# Patient Record
Sex: Female | Born: 1970 | Race: White | Hispanic: No | Marital: Married | Smoking: Former smoker
Health system: Southern US, Community
[De-identification: ages and names within clinical notes are randomized; demographics above are authoritative.]

## PROBLEM LIST (undated history)

## (undated) DIAGNOSIS — M797 Fibromyalgia: Secondary | ICD-10-CM

## (undated) DIAGNOSIS — G4730 Sleep apnea, unspecified: Secondary | ICD-10-CM

## (undated) DIAGNOSIS — R011 Cardiac murmur, unspecified: Secondary | ICD-10-CM

## (undated) DIAGNOSIS — R51 Headache: Secondary | ICD-10-CM

## (undated) DIAGNOSIS — J45909 Unspecified asthma, uncomplicated: Secondary | ICD-10-CM

## (undated) DIAGNOSIS — E039 Hypothyroidism, unspecified: Secondary | ICD-10-CM

## (undated) DIAGNOSIS — K219 Gastro-esophageal reflux disease without esophagitis: Secondary | ICD-10-CM

## (undated) HISTORY — PX: CARPAL TUNNEL RELEASE: SHX101

## (undated) HISTORY — PX: COLONOSCOPY: SHX5424

## (undated) HISTORY — PX: WISDOM TOOTH EXTRACTION: SHX21

---

## 2005-08-14 ENCOUNTER — Ambulatory Visit: Payer: Self-pay | Admitting: Family Medicine

## 2005-12-11 ENCOUNTER — Ambulatory Visit: Payer: Self-pay | Admitting: Unknown Physician Specialty

## 2005-12-18 ENCOUNTER — Ambulatory Visit: Payer: Self-pay | Admitting: Unknown Physician Specialty

## 2006-04-08 IMAGING — RF DG UGI W/ KUB
1 series · 14 of 24 positions shown · non-contrast
Comparison: none

REASON FOR EXAM: Abdominal pain, nausea
COMMENTS:

[Series 1: run · 14 of 25 slices shown]
[im 1/25]
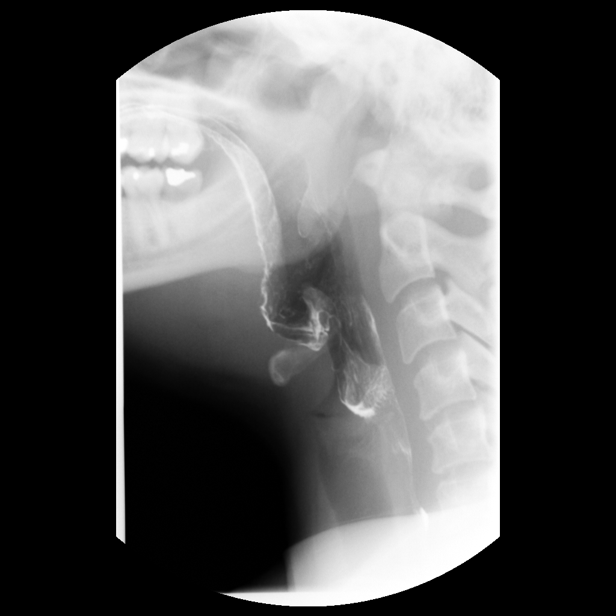
[im 3/25]
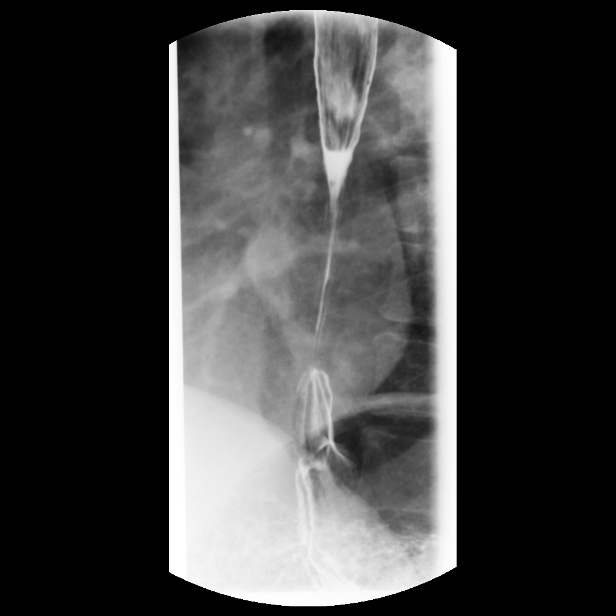
[im 5/25]
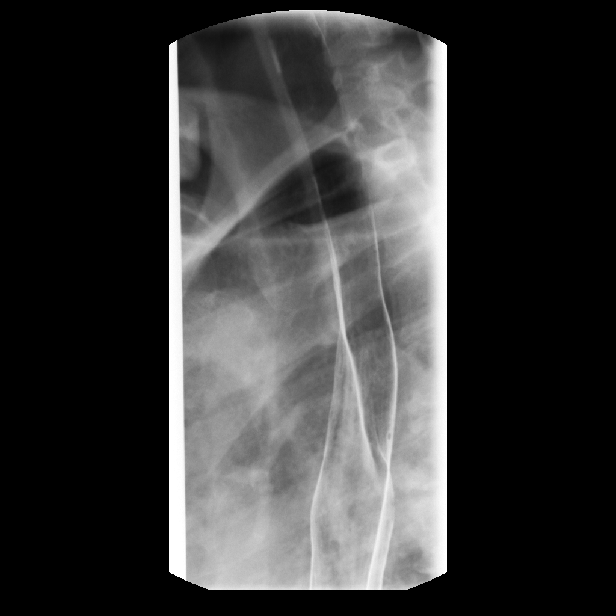
[im 7/25]
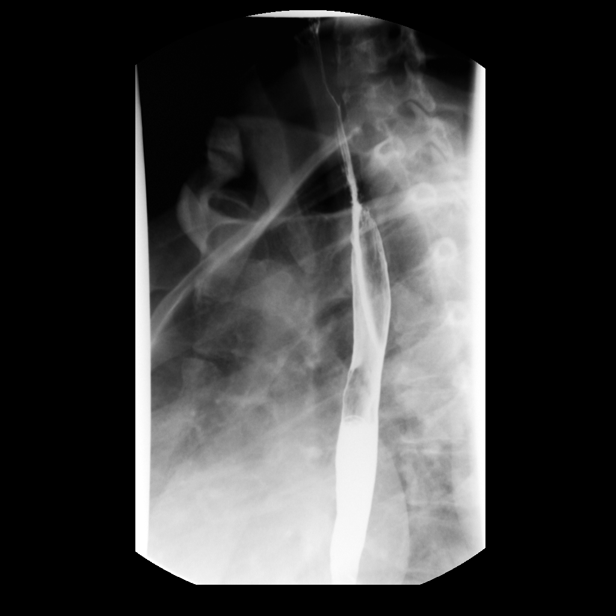
[im 8/25]
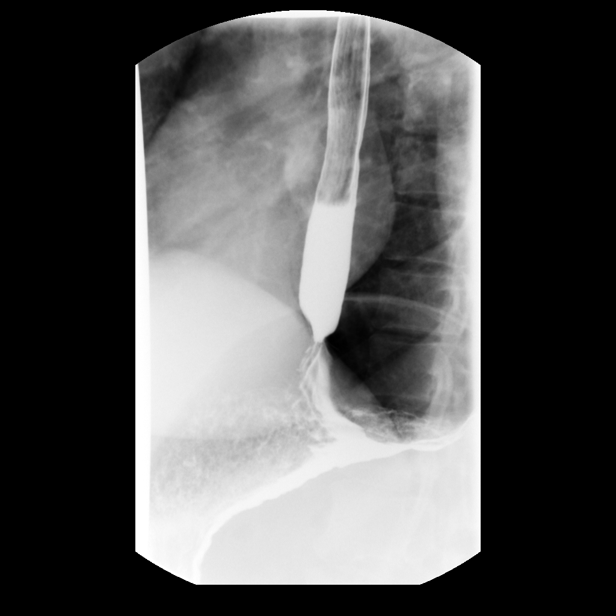
[im 10/25]
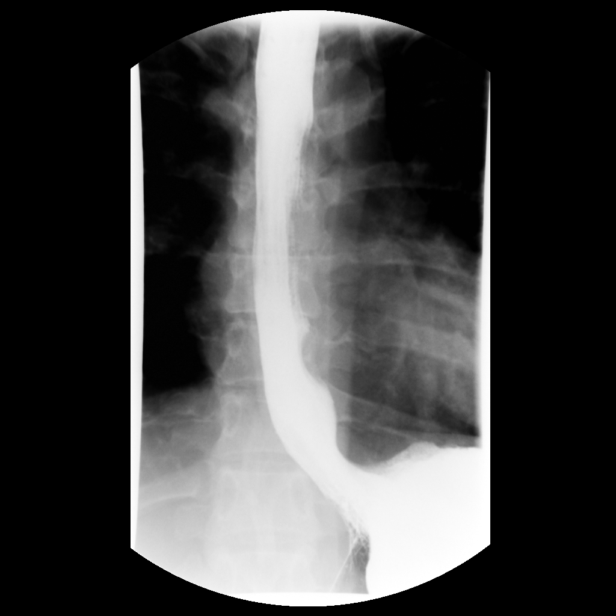
[im 12/25]
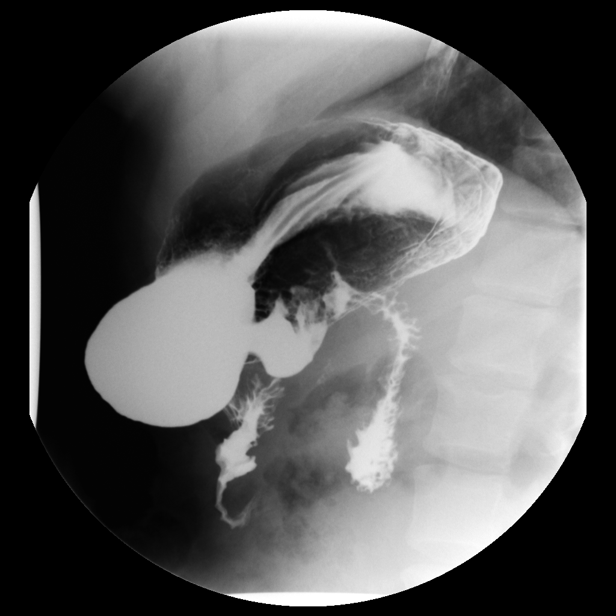
[im 13/25]
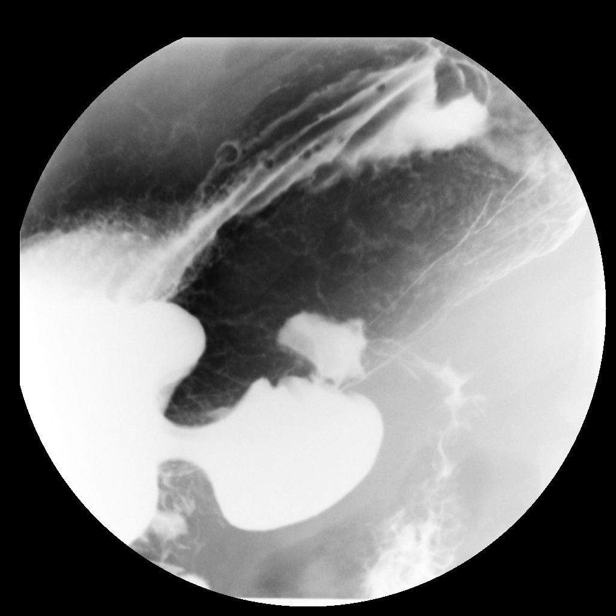
[im 15/25]
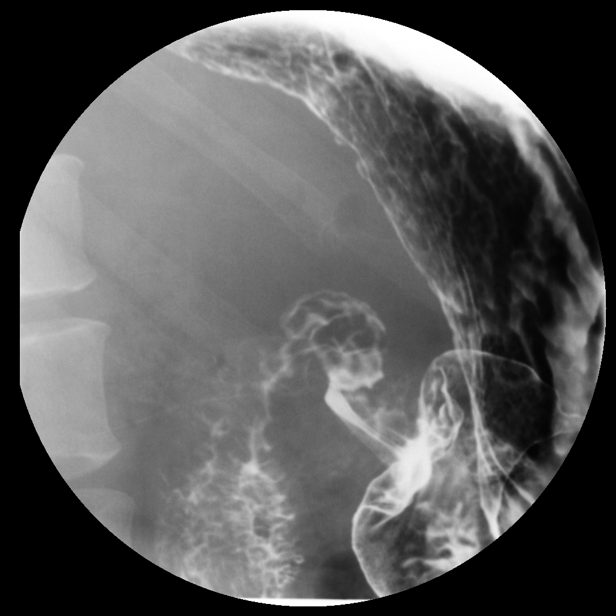
[im 17/25]
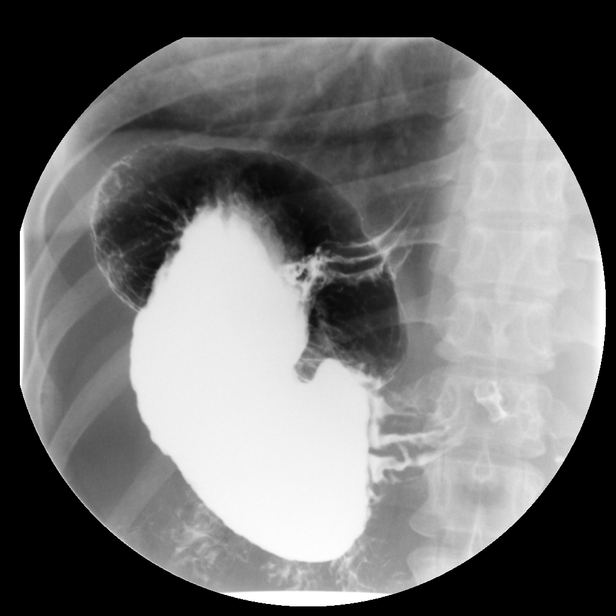
[im 19/25]
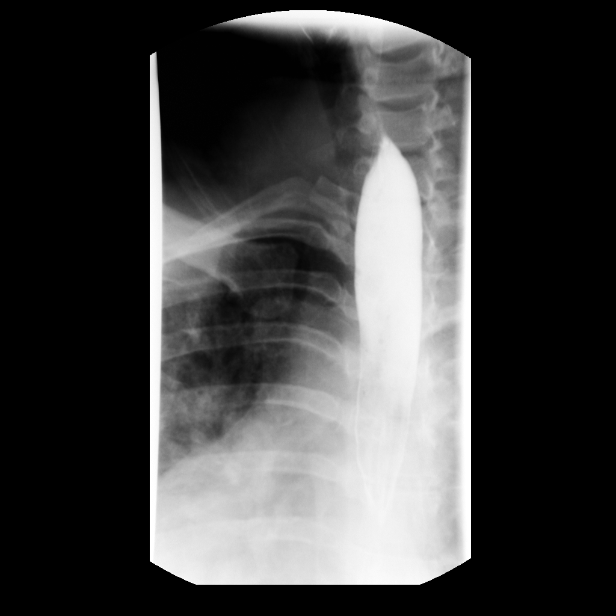
[im 20/25]
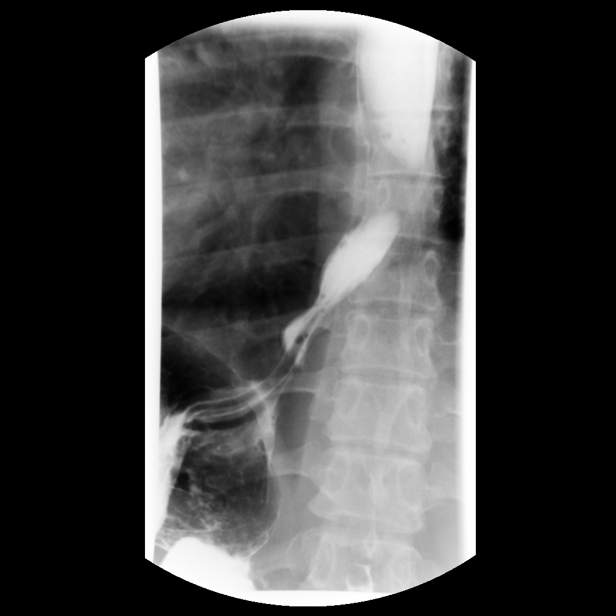
[im 22/25]
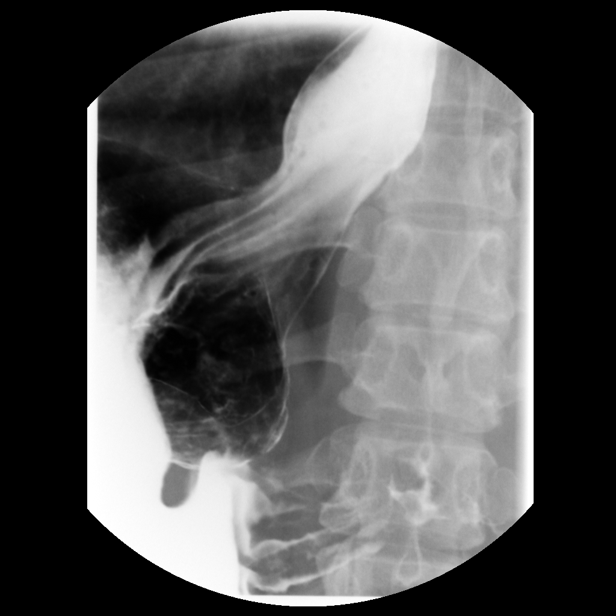
[im 25/25]
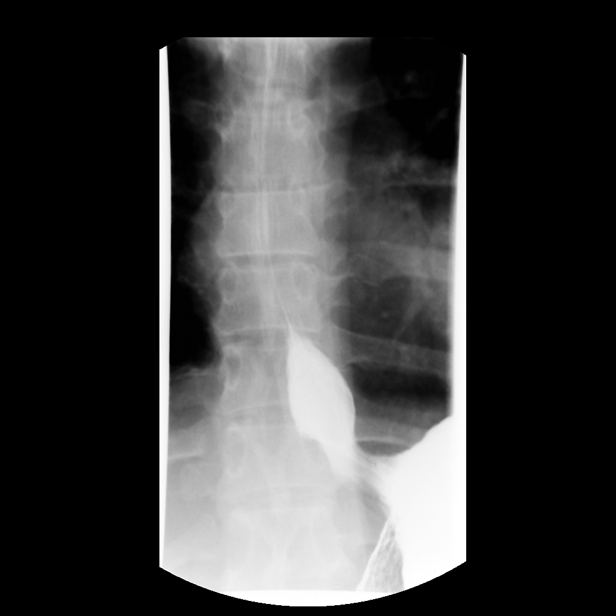

[14 of 24 positions shown; findings below may reference images not displayed]

PROCEDURE:     FL  - FL UPPER GI W/ BARIUM SWALLOW  - December 18, 2005  [DATE]

RESULT:     The anticipated procedure was discussed with Mrs. Nayiba.  The
patient has a history of RIGHT upper quadrant discomfort.  The patient has
undergone recent hepatobiliary scan and abdominal ultrasound which were
negative.  The patient has had a recent negative pregnancy test.  The
patient voiced her willingness to proceed.  The cervical esophagus distended
well.  There was no laryngeal penetration of the barium.  The thoracic
esophagus also distended well.  Esophageal motility appeared normal.  There
was no evidence of esophagitis.  However, there is marked gastroesophageal
reflux when the patient turns from the supine to the prone position and back
again.  This does clear spontaneously.  There is no significant hiatal
hernia demonstrated though I cannot exclude a very small reducible hiatal
hernia.

The stomach distends well.  Gastric emptying appears normal.  The gastric
mucosa appears normal.  The duodenal bulb and C sweep were normal in
appearance.

During the study the patient experienced her typical pain.  It was over the
RIGHT upper quadrant of the abdomen in the region of some gas at the level
of the hepatic flexure.  The patient did not voice any tenderness to
palpation over the visualized stomach and duodenum.  The 12-mm barium pill
passed without difficulty.
IMPRESSION: There is marked gastroesophageal reflux which occurs with changes in patient
positioning but which does clear spontaneously.  No more than a small
reducible hiatal hernia was seen.  There is no evidence of esophagitis.

The stomach and duodenal bulb and C sweep appear normal.

The patient's pain was noted to be in the RIGHT upper quadrant of the
abdomen overlying the region of the liver and the hepatic flexure of the
colon.  Given the lack of significant abnormality beyond reflux on the
current study and the negative abdominal ultrasound and hepatobiliary scan,
if further imaging is desired, CT scanning of the abdomen and pelvis may be
the most useful next steps.

## 2006-05-27 ENCOUNTER — Other Ambulatory Visit: Payer: Self-pay

## 2006-05-27 ENCOUNTER — Emergency Department: Payer: Self-pay | Admitting: Emergency Medicine

## 2006-05-31 ENCOUNTER — Emergency Department: Payer: Self-pay

## 2006-06-17 ENCOUNTER — Ambulatory Visit: Payer: Self-pay | Admitting: Unknown Physician Specialty

## 2006-07-14 ENCOUNTER — Ambulatory Visit: Payer: Self-pay | Admitting: Unknown Physician Specialty

## 2007-01-04 ENCOUNTER — Ambulatory Visit: Payer: Self-pay | Admitting: Unknown Physician Specialty

## 2007-01-10 ENCOUNTER — Ambulatory Visit: Payer: Self-pay | Admitting: Unknown Physician Specialty

## 2007-12-20 ENCOUNTER — Ambulatory Visit: Payer: Self-pay

## 2008-02-16 ENCOUNTER — Emergency Department: Payer: Self-pay | Admitting: Emergency Medicine

## 2009-04-03 ENCOUNTER — Encounter: Admission: RE | Admit: 2009-04-03 | Discharge: 2009-07-02 | Payer: Self-pay | Admitting: Family Medicine

## 2012-04-20 ENCOUNTER — Other Ambulatory Visit: Payer: Self-pay | Admitting: Neurosurgery

## 2012-04-21 ENCOUNTER — Encounter (HOSPITAL_COMMUNITY): Payer: Self-pay | Admitting: Pharmacy Technician

## 2012-04-25 ENCOUNTER — Encounter (HOSPITAL_COMMUNITY): Payer: Self-pay

## 2012-04-25 ENCOUNTER — Encounter (HOSPITAL_COMMUNITY)
Admission: RE | Admit: 2012-04-25 | Discharge: 2012-04-25 | Disposition: A | Discharge status: Home / Self Care | Payer: PRIVATE HEALTH INSURANCE | Source: Ambulatory Visit | Attending: Neurosurgery | Admitting: Neurosurgery

## 2012-04-25 HISTORY — DX: Fibromyalgia: M79.7

## 2012-04-25 HISTORY — DX: Unspecified asthma, uncomplicated: J45.909

## 2012-04-25 HISTORY — DX: Headache: R51

## 2012-04-25 HISTORY — DX: Hypothyroidism, unspecified: E03.9

## 2012-04-25 HISTORY — DX: Gastro-esophageal reflux disease without esophagitis: K21.9

## 2012-04-25 HISTORY — DX: Sleep apnea, unspecified: G47.30

## 2012-04-25 HISTORY — DX: Cardiac murmur, unspecified: R01.1

## 2012-04-25 LAB — CBC
HCT: 37.3 % (ref 36.0–46.0)
Hemoglobin: 12.6 g/dL (ref 12.0–15.0)
MCH: 31 pg (ref 26.0–34.0)
MCHC: 33.8 g/dL (ref 30.0–36.0)
RDW: 12.4 % (ref 11.5–15.5)

## 2012-04-25 NOTE — Pre-Procedure Instructions (Signed)
21 Lauren Fowler  04/25/2012   Your procedure is scheduled on: Thursday, June 27th.  Report to [facility] at 10:20AM.  Call this number if you have problems the morning of surgery: [phone]   Remember:   Do not eat food:After Midnight.  May have clear liquids:  Take these medicines the morning of surgery with A SIP OF WATER: Levothyroxine (Synthyroid), Omeprazole (Prilosec), Gabapentin (Neurontin).  May take Oxycodone- Acetaminophen (Percocet) if needed.  Do not wear jewelry, make-up or nail polish.  Do not wear lotions, powders, or perfumes. You may wear deodorant.  Do not shave 48 hours prior to surgery. Men may shave face and neck.  Do not bring valuables to the hospital.  Contacts, dentures or bridgework may not be worn into surgery.  Leave suitcase in the car. After surgery it may be brought to your room.  For patients admitted to the hospital, checkout time is 11:00 AM the day of discharge.   Patients discharged the day of surgery will not be allowed to drive home.  Name and phone number of your driver: NA  Special Instructions: CHG Shower Use Special Wash: 1/2 bottle night before surgery and 1/2 bottle morning of surgery.   Please read over the following fact sheets that you were given: Pain Booklet, Coughing and Deep Breathing and Surgical Site Infection Prevention

## 2012-04-27 NOTE — H&P (Signed)
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 She complains of pain in her left arm to her fingertips and numbness in her left arm, and weakness in her left arm. She says this began 03/04/2012 when she was exercising in a boot camp class in an effort to lose weight to increase her fertility.  She says that currently she is having horrible pain in her left arm and describes it has "liquid fire" to her first and second digits.  She has been taking Gabapentin 900 mg. three times daily, Vicodin 5/500 two to three per day, and Flexeril 10 mg. once daily.    REVIEW OF SYSTEMS:   A detailed Review of Systems sheet was reviewed with the patient.  Pertinent positives include under eyes - she wears glasses, under cardiovascular - she notes heart murmur, under musculoskeletal - she notes arm weakness and arm pain, under endocrine - she notes thyroid disease.  All other systems are negative; this includes Constitutional symptoms, Ears, nose, mouth, throat, Respiratory, Gastrointestinal, Genitourinary, Integumentary & Breast, Neurologic, Psychiatric, Hematologic/Lymphatic, Allergic/Immunologic.    PAST MEDICAL HISTORY:    . Current Medical Conditions:    She has a history of hypothyroidism, GERD, and heart murmur.    . Prior Operations and Hospitalizations:   She has had previous right carpal tunnel release in 2007.    . Medications and Allergies:  Current medications - Levothyroxine 125 mcg. daily for hypothyroidism, Gabapentin 900 mg. three times daily for pain, Hydrocodone/APAP 5/500 every four to six hours as needed for pain, Cyclobenzaprine 10 mg. once daily for muscle spasms, and Omeprazole 20 mg. daily for acid reflux.  She has no known drug allergies.    Marland Kitchen Height and Weight:     She is currently 5'4" tall with a weight of 217 lbs.  This  corresponds with a BMI of 37.2.     FAMILY HISTORY:    Her mother is 60 in good in good health.  Her father died. There is a family history of high blood pressure and heart attack.    SOCIAL HISTORY:    She denies tobacco, alcohol, or drug use.    DIAGNOSTIC STUDIES:   She underwent cervical radiographs which showed mild degenerative changes at C3-4 and some reversal of the cervical lordosis, with C5-6 and C4-5  left-sided foraminal stenosis, most pronounced at the C5-6 level on the left.    The MRI was done the 24th of May 2013 and shows a left paracentral to foraminal disc herniation which impinges the ventral roots at the C4-5 level. At C5-6 there is a broad-based disc bulge and endplate and uncovertebral osteophytes which contribute to moderate to advanced biforaminal stenosis, with a small central disc herniation. The other levels do not appear to be severely affected.    PHYSICAL EXAMINATION:    . General Appearance:   On examination today, Lauren Fowler is a pleasant and cooperative woman in no acute distress.    . Blood Pressure, Pulse:     Her blood pressure is 148/98.  Heart rate is 80 and regular.  Respiratory rate is 18.    Marland Kitchen HEENT - normocephalic, atraumatic.  The pupils are equal, round and reactive to light.  The extraocular muscles are intact.  Sclerae - white.  Conjunctiva - pink.  Oropharynx  benign.  Uvula midline.   . Neck - there are no masses, meningismus, deformities, tracheal deviation, jugular vein distention or carotid bruits.  There is normal cervical range of motion.  She has a positive Spurlings' maneuver to the left, positive parascapular discomfort to the left. She has negative shoulder impingement testing bilaterally.  Lhermitte's sign is not present with axial compression.    Marland Kitchen Respiratory - there is normal respiratory effort with good intercostal function.  Lungs are clear to auscultation.  There are no rales, rhonchi or wheezes.    . Cardiovascular - the heart  has regular rate and rhythm to auscultation.  No murmurs are appreciated.  There is no extremity edema, cyanosis or clubbing.  There are palpable pedal pulses.     . Abdomen - soft, nontender, no hepatosplenomegaly appreciated or masses.  There are active bowel sounds.  No guarding or rebound.    . Musculoskeletal Examination - the patient is able to walk about the examining room with a normal, casual, heel and toe gait.    NEUROLOGICAL EXAMINATION: The patient is oriented to time, person and place and has good recall of both recent and remote memory with normal attention span and concentration.  The patient speaks with clear and fluent speech and exhibits normal language function and appropriate fund of knowledge.    . Cranial Nerve Examination - pupils are equal, round and reactive to light.  Extraocular movements are full.  Visual fields are full to confrontational testing.  Facial sensation and facial movement are symmetric and intact.  Hearing is intact to finger rub.  Palate is upgoing.  Shoulder shrug is symmetric.  Tongue protrudes in the midline.    . Motor Examination - motor strength is 5/5 in the bilateral, triceps, handgrips, interosseous, 4+/5 left deltoid strength, 5/5 right deltoid, 4-/5 left biceps and wrist extension strength, and 5/5 right biceps and wrist extension.  In the lower extremities motor strength is 5/5 in hip flexion, extension, quadriceps, hamstrings, plantar flexion, dorsiflexion and extensor hallucis longus.    . Sensory Examination - she has decreased pin sensation in a left distribution in the left deltoid. She says that her first and second digits, while they are painful and feel numb, do not have diminished sensation to pinprick.    . Deep Tendon Reflexes - Deep tendon reflexes are trace in the upper extremities, 2 in the knees, 2 in the ankles.  The great toes are downgoing to plantar stimulation.    . Cerebellar Examination - normal coordination in upper and  lower extremities and normal rapid alternating movements.  Romberg test is negative.    IMPRESSION AND RECOMMENDATIONS: Lauren Fowler is a 41 year old woman with left arm pain and weakness.  She describes this as miserably painful.  She wants to get some relief.  She has significant disc herniations at C4-5 and C5-6 levels.  I have recommended that we go ahead with surgery.  We will set this up for 04/28/2012.  I am going to take her out of work for approximately six weeks postoperatively.  She was fitted for a soft cervical collar today. We spent in excess of 45 minutes meeting with the patient and discussing surgery and reviewing attendant risks and benefits.  The plan is for anterior cervical decompression and fusion at C4-5 and C5-6 levels on 04/28/2012.    I have recommended to the patient that she undergo anterior cervical discectomy and fusion with plating.  I went over the diagnostic studies in detail  and reviewed surgical models and also discussed the exact nature of the surgical procedure, attendant risks, potential benefits and typical operative and postoperative course.  I discussed the risks of surgery which include, but are not limited to, risks of anesthesia, blood loss, infection, injury to various neck structures including the trachea, esophagus, which could cause temporary or permanent swallowing difficulties and also the potential for perforation of the esophagus which might require operative intervention, larynx, recurrent laryngeal nerve, which could cause either temporary or permanent vocal cord paralysis resulting in either temporary or permanent voice changes, injury to cervical nerve roots, which could cause either temporary or permanent arm pain, numbness and/or weakness.  There is a small chance of injury to the spinal cord which could cause paralysis.  There is also the potential for malplacement of instrumentation, fusion failure, need for repeat surgery, degenerative disease at  other levels in the neck, failure to relieve the pain, worsening of pain.  I also discussed with the patient that she will lose some neck mobility from the surgery.  It is typical to stay in the hospital overnight after this operation.  Typically she will not be able to drive for two weeks after surgery and will come back to see me two weeks following surgery with a lateral C-spine x-ray and then for monthly visits to three months after surgery.  Generally patients are out of work for four to six weeks following surgery.  She will wear a soft collar for two weeks after surgery.     [facility]    Danae Orleans. Venetia Maxon, M.D.  JDS:aft

## 2012-04-27 NOTE — Interval H&P Note (Signed)
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 After consideration of risks, benefits and other options for treatment, the patient has consented to  Procedure(s) (LRB): ANTERIOR CERVICAL DECOMPRESSION/DISCECTOMY FUSION 2 LEVELS (N/A) as a surgical intervention .  The patient's history has been reviewed, patient examined, no change in status, stable for surgery.  I have reviewed the patients' chart and labs.  Questions were answered to the patient's satisfaction.     [name] D  Date of Initial H&P: 04/27/2012  History reviewed, patient examined, no change in status, stable for surgery.

## 2012-04-28 ENCOUNTER — Encounter (HOSPITAL_COMMUNITY): Payer: Self-pay | Admitting: *Deleted

## 2012-04-28 ENCOUNTER — Ambulatory Visit (HOSPITAL_COMMUNITY)
Admission: RE | Admit: 2012-04-28 | Discharge: 2012-04-29 | Disposition: A | Discharge status: Home / Self Care | Payer: PRIVATE HEALTH INSURANCE | Source: Ambulatory Visit | Attending: Neurosurgery | Admitting: Neurosurgery

## 2012-04-28 ENCOUNTER — Encounter (HOSPITAL_COMMUNITY)
Admission: RE | Disposition: A | Discharge status: Home / Self Care | Payer: Self-pay | Source: Ambulatory Visit | Attending: Neurosurgery

## 2012-04-28 ENCOUNTER — Ambulatory Visit (HOSPITAL_COMMUNITY): Payer: PRIVATE HEALTH INSURANCE

## 2012-04-28 ENCOUNTER — Ambulatory Visit (HOSPITAL_COMMUNITY): Payer: PRIVATE HEALTH INSURANCE | Admitting: Certified Registered Nurse Anesthetist

## 2012-04-28 ENCOUNTER — Encounter (HOSPITAL_COMMUNITY): Payer: Self-pay | Admitting: Certified Registered Nurse Anesthetist

## 2012-04-28 DIAGNOSIS — IMO0001 Reserved for inherently not codable concepts without codable children: Secondary | ICD-10-CM | POA: Insufficient documentation

## 2012-04-28 DIAGNOSIS — M47812 Spondylosis without myelopathy or radiculopathy, cervical region: Secondary | ICD-10-CM | POA: Insufficient documentation

## 2012-04-28 DIAGNOSIS — Z01812 Encounter for preprocedural laboratory examination: Secondary | ICD-10-CM | POA: Insufficient documentation

## 2012-04-28 DIAGNOSIS — G4730 Sleep apnea, unspecified: Secondary | ICD-10-CM | POA: Insufficient documentation

## 2012-04-28 DIAGNOSIS — Z01810 Encounter for preprocedural cardiovascular examination: Secondary | ICD-10-CM | POA: Insufficient documentation

## 2012-04-28 DIAGNOSIS — J45909 Unspecified asthma, uncomplicated: Secondary | ICD-10-CM | POA: Insufficient documentation

## 2012-04-28 DIAGNOSIS — M5020 Other cervical disc displacement, unspecified cervical region: Secondary | ICD-10-CM | POA: Insufficient documentation

## 2012-04-28 DIAGNOSIS — K219 Gastro-esophageal reflux disease without esophagitis: Secondary | ICD-10-CM | POA: Insufficient documentation

## 2012-04-28 HISTORY — PX: ANTERIOR CERVICAL DECOMP/DISCECTOMY FUSION: SHX1161

## 2012-04-28 SURGERY — ANTERIOR CERVICAL DECOMPRESSION/DISCECTOMY FUSION 2 LEVELS
Anesthesia: General | Site: Neck | Wound class: Clean

## 2012-04-28 MED ORDER — OXYCODONE-ACETAMINOPHEN 5-325 MG PO TABS: 1.0000 | ORAL_TABLET | ORAL | Status: DC | PRN

## 2012-04-28 MED ORDER — BACITRACIN 50000 UNITS IM SOLR
INTRAMUSCULAR | Status: DC | PRN
  Administered 2012-04-28: 14:00:00

## 2012-04-28 MED ORDER — VECURONIUM BROMIDE 10 MG IV SOLR
INTRAVENOUS | Status: DC | PRN
  Administered 2012-04-28: 2 mg via INTRAVENOUS

## 2012-04-28 MED ORDER — OXYCODONE-ACETAMINOPHEN 5-325 MG PO TABS
1.0000 | ORAL_TABLET | ORAL | Status: DC | PRN
Start: 2012-04-28 — End: 2012-04-29
  Administered 2012-04-28: 2 via ORAL
  Filled 2012-04-28: qty 2

## 2012-04-28 MED ORDER — DIAZEPAM 5 MG PO TABS: 5.0000 mg | ORAL_TABLET | Freq: Four times a day (QID) | ORAL | Status: DC | PRN

## 2012-04-28 MED ORDER — PHENYLEPHRINE HCL 10 MG/ML IJ SOLN
INTRAMUSCULAR | Status: DC | PRN
  Administered 2012-04-28: 80 ug via INTRAVENOUS
  Administered 2012-04-28: 40 ug via INTRAVENOUS
  Administered 2012-04-28: 80 ug via INTRAVENOUS

## 2012-04-28 MED ORDER — BACITRACIN 50000 UNITS IM SOLR
INTRAMUSCULAR | Status: AC
  Filled 2012-04-28: qty 1

## 2012-04-28 MED ORDER — CEFAZOLIN SODIUM-DEXTROSE 2-3 GM-% IV SOLR: 2.0000 g | INTRAVENOUS | Status: DC

## 2012-04-28 MED ORDER — ROCURONIUM BROMIDE 100 MG/10ML IV SOLN
INTRAVENOUS | Status: DC | PRN
  Administered 2012-04-28: 50 mg via INTRAVENOUS

## 2012-04-28 MED ORDER — 0.9 % SODIUM CHLORIDE (POUR BTL) OPTIME
TOPICAL | Status: DC | PRN
  Administered 2012-04-28: 1000 mL

## 2012-04-28 MED ORDER — ONDANSETRON HCL 4 MG/2ML IJ SOLN
4.0000 mg | INTRAMUSCULAR | Status: DC | PRN
  Administered 2012-04-28: 4 mg via INTRAVENOUS
  Filled 2012-04-28: qty 2

## 2012-04-28 MED ORDER — GABAPENTIN 300 MG PO CAPS
900.0000 mg | ORAL_CAPSULE | Freq: Three times a day (TID) | ORAL | Status: DC
  Administered 2012-04-28: 900 mg via ORAL
  Filled 2012-04-28 (×5): qty 3

## 2012-04-28 MED ORDER — CEFAZOLIN SODIUM-DEXTROSE 2-3 GM-% IV SOLR
INTRAVENOUS | Status: AC
  Filled 2012-04-28: qty 50

## 2012-04-28 MED ORDER — CEFAZOLIN SODIUM 1-5 GM-% IV SOLN: 1.0000 g | INTRAVENOUS | Status: DC

## 2012-04-28 MED ORDER — HYDROCODONE-ACETAMINOPHEN 5-325 MG PO TABS: 1.0000 | ORAL_TABLET | ORAL | Status: DC | PRN

## 2012-04-28 MED ORDER — IPRATROPIUM-ALBUTEROL 18-103 MCG/ACT IN AERO
1.0000 | INHALATION_SPRAY | Freq: Four times a day (QID) | RESPIRATORY_TRACT | Status: DC | PRN
  Filled 2012-04-28: qty 14.7

## 2012-04-28 MED ORDER — ZOLPIDEM TARTRATE 5 MG PO TABS: 10.0000 mg | ORAL_TABLET | Freq: Every evening | ORAL | Status: DC | PRN

## 2012-04-28 MED ORDER — HEMOSTATIC AGENTS (NO CHARGE) OPTIME
TOPICAL | Status: DC | PRN
  Administered 2012-04-28: 1 via TOPICAL

## 2012-04-28 MED ORDER — ACETAMINOPHEN 650 MG RE SUPP: 650.0000 mg | RECTAL | Status: DC | PRN

## 2012-04-28 MED ORDER — SODIUM CHLORIDE 0.9 % IV SOLN
INTRAVENOUS | Status: AC
  Filled 2012-04-28: qty 500

## 2012-04-28 MED ORDER — DEXAMETHASONE SODIUM PHOSPHATE 4 MG/ML IJ SOLN
INTRAMUSCULAR | Status: DC | PRN
  Administered 2012-04-28: 10 mg via INTRAVENOUS

## 2012-04-28 MED ORDER — EPHEDRINE SULFATE 50 MG/ML IJ SOLN
INTRAMUSCULAR | Status: DC | PRN
  Administered 2012-04-28 (×2): 10 mg via INTRAVENOUS

## 2012-04-28 MED ORDER — HYDROMORPHONE HCL PF 1 MG/ML IJ SOLN
0.2500 mg | INTRAMUSCULAR | Status: DC | PRN
  Administered 2012-04-28 (×4): 0.5 mg via INTRAVENOUS

## 2012-04-28 MED ORDER — THROMBIN 5000 UNITS EX KIT
PACK | CUTANEOUS | Status: DC | PRN
  Administered 2012-04-28 (×2): 5000 [IU] via TOPICAL

## 2012-04-28 MED ORDER — HYDROMORPHONE HCL PF 1 MG/ML IJ SOLN
INTRAMUSCULAR | Status: AC
  Filled 2012-04-28: qty 1

## 2012-04-28 MED ORDER — LIDOCAINE HCL (CARDIAC) 20 MG/ML IV SOLN
INTRAVENOUS | Status: DC | PRN
  Administered 2012-04-28: 80 mg via INTRAVENOUS

## 2012-04-28 MED ORDER — MIDAZOLAM HCL 5 MG/5ML IJ SOLN
INTRAMUSCULAR | Status: DC | PRN
  Administered 2012-04-28: 2 mg via INTRAVENOUS

## 2012-04-28 MED ORDER — ACETAMINOPHEN 325 MG PO TABS: 650.0000 mg | ORAL_TABLET | ORAL | Status: DC | PRN

## 2012-04-28 MED ORDER — LACTATED RINGERS IV SOLN
INTRAVENOUS | Status: DC | PRN
  Administered 2012-04-28 (×2): via INTRAVENOUS

## 2012-04-28 MED ORDER — KCL IN DEXTROSE-NACL 20-5-0.45 MEQ/L-%-% IV SOLN
INTRAVENOUS | Status: AC
  Filled 2012-04-28: qty 1000

## 2012-04-28 MED ORDER — MORPHINE SULFATE 2 MG/ML IJ SOLN: 1.0000 mg | INTRAMUSCULAR | Status: DC | PRN

## 2012-04-28 MED ORDER — PROPOFOL 10 MG/ML IV BOLUS
INTRAVENOUS | Status: DC | PRN
  Administered 2012-04-28: 160 mg via INTRAVENOUS

## 2012-04-28 MED ORDER — PANTOPRAZOLE SODIUM 40 MG IV SOLR: 40.0000 mg | Freq: Every day | INTRAVENOUS | Status: DC

## 2012-04-28 MED ORDER — KCL IN DEXTROSE-NACL 20-5-0.45 MEQ/L-%-% IV SOLN
INTRAVENOUS | Status: DC
  Administered 2012-04-28: 17:00:00 via INTRAVENOUS
  Filled 2012-04-28 (×3): qty 1000

## 2012-04-28 MED ORDER — ONDANSETRON HCL 4 MG/2ML IJ SOLN
INTRAMUSCULAR | Status: DC | PRN
  Administered 2012-04-28: 4 mg via INTRAVENOUS

## 2012-04-28 MED ORDER — DIAZEPAM 5 MG PO TABS
5.0000 mg | ORAL_TABLET | Freq: Every day | ORAL | Status: DC
  Administered 2012-04-28: 5 mg via ORAL
  Filled 2012-04-28: qty 1

## 2012-04-28 MED ORDER — FENTANYL CITRATE 0.05 MG/ML IJ SOLN
INTRAMUSCULAR | Status: DC | PRN
  Administered 2012-04-28: 200 ug via INTRAVENOUS
  Administered 2012-04-28: 50 ug via INTRAVENOUS

## 2012-04-28 MED ORDER — DOCUSATE SODIUM 100 MG PO CAPS
100.0000 mg | ORAL_CAPSULE | Freq: Two times a day (BID) | ORAL | Status: DC
  Administered 2012-04-28: 100 mg via ORAL
  Filled 2012-04-28: qty 1

## 2012-04-28 MED ORDER — PANTOPRAZOLE SODIUM 40 MG PO TBEC: 40.0000 mg | DELAYED_RELEASE_TABLET | Freq: Every day | ORAL | Status: DC

## 2012-04-28 MED ORDER — SODIUM CHLORIDE 0.9 % IJ SOLN
3.0000 mL | Freq: Two times a day (BID) | INTRAMUSCULAR | Status: DC
  Administered 2012-04-28: 3 mL via INTRAVENOUS

## 2012-04-28 MED ORDER — CEFAZOLIN SODIUM 1-5 GM-% IV SOLN
1.0000 g | Freq: Three times a day (TID) | INTRAVENOUS | Status: AC
  Administered 2012-04-28 – 2012-04-29 (×2): 1 g via INTRAVENOUS
  Filled 2012-04-28 (×2): qty 50

## 2012-04-28 MED ORDER — SODIUM CHLORIDE 0.9 % IJ SOLN: 3.0000 mL | INTRAMUSCULAR | Status: DC | PRN

## 2012-04-28 MED ORDER — MENTHOL 3 MG MT LOZG: 1.0000 | LOZENGE | OROMUCOSAL | Status: DC | PRN

## 2012-04-28 MED ORDER — ALBUTEROL SULFATE HFA 108 (90 BASE) MCG/ACT IN AERS
2.0000 | INHALATION_SPRAY | Freq: Four times a day (QID) | RESPIRATORY_TRACT | Status: DC | PRN
  Filled 2012-04-28: qty 6.7

## 2012-04-28 MED ORDER — PHENOL 1.4 % MT LIQD: 1.0000 | OROMUCOSAL | Status: DC | PRN

## 2012-04-28 MED ORDER — LIDOCAINE HCL 4 % MT SOLN
OROMUCOSAL | Status: DC | PRN
  Administered 2012-04-28: 4 mL via TOPICAL

## 2012-04-28 MED ORDER — LEVOTHYROXINE SODIUM 125 MCG PO TABS
125.0000 ug | ORAL_TABLET | Freq: Every day | ORAL | Status: DC
  Filled 2012-04-28 (×2): qty 1

## 2012-04-28 SURGICAL SUPPLY — 71 items
BAG DECANTER FOR FLEXI CONT (MISCELLANEOUS) ×2 IMPLANT
BANDAGE GAUZE ELAST BULKY 4 IN (GAUZE/BANDAGES/DRESSINGS) ×4 IMPLANT
BENZOIN TINCTURE PRP APPL 2/3 (GAUZE/BANDAGES/DRESSINGS) ×2 IMPLANT
BIT DRILL 2.3 12 FIXED (INSTRUMENTS) ×1 IMPLANT
BIT DRILL NEURO 2X3.1 SFT TUCH (MISCELLANEOUS) ×1 IMPLANT
BLADE ULTRA TIP 2M (BLADE) ×2 IMPLANT
BLOCK PROFUSE SZ 6 (Bone Implant) ×2 IMPLANT
BUR BARREL STRAIGHT FLUTE 4.0 (BURR) ×2 IMPLANT
CAGE CERVICAL 7 (Cage) ×2 IMPLANT
CANISTER SUCTION 2500CC (MISCELLANEOUS) ×2 IMPLANT
CLOTH BEACON ORANGE TIMEOUT ST (SAFETY) ×2 IMPLANT
CONT SPEC 4OZ CLIKSEAL STRL BL (MISCELLANEOUS) ×2 IMPLANT
COVER MAYO STAND STRL (DRAPES) ×2 IMPLANT
DERMABOND ADVANCED (GAUZE/BANDAGES/DRESSINGS) ×1
DERMABOND ADVANCED .7 DNX12 (GAUZE/BANDAGES/DRESSINGS) ×1 IMPLANT
DRAPE LAPAROTOMY 100X72 PEDS (DRAPES) ×2 IMPLANT
DRAPE MICROSCOPE LEICA (MISCELLANEOUS) ×2 IMPLANT
DRAPE POUCH INSTRU U-SHP 10X18 (DRAPES) ×2 IMPLANT
DRAPE PROXIMA HALF (DRAPES) ×2 IMPLANT
DRESSING TELFA 8X3 (GAUZE/BANDAGES/DRESSINGS) IMPLANT
DRILL 12MM (INSTRUMENTS) ×2
DRILL NEURO 2X3.1 SOFT TOUCH (MISCELLANEOUS) ×2
DURAPREP 6ML APPLICATOR 50/CS (WOUND CARE) ×2 IMPLANT
ELECT COATED BLADE 2.86 ST (ELECTRODE) ×2 IMPLANT
ELECT REM PT RETURN 9FT ADLT (ELECTROSURGICAL) ×2
ELECTRODE REM PT RTRN 9FT ADLT (ELECTROSURGICAL) ×1 IMPLANT
GAUZE SPONGE 4X4 16PLY XRAY LF (GAUZE/BANDAGES/DRESSINGS) IMPLANT
GLOVE BIO SURGEON STRL SZ8 (GLOVE) ×2 IMPLANT
GLOVE BIO SURGEON STRL SZ8.5 (GLOVE) ×2 IMPLANT
GLOVE BIOGEL PI IND STRL 8 (GLOVE) ×1 IMPLANT
GLOVE BIOGEL PI IND STRL 8.5 (GLOVE) ×3 IMPLANT
GLOVE BIOGEL PI INDICATOR 8 (GLOVE) ×1
GLOVE BIOGEL PI INDICATOR 8.5 (GLOVE) ×3
GLOVE ECLIPSE 8.0 STRL XLNG CF (GLOVE) ×2 IMPLANT
GLOVE EXAM NITRILE LRG STRL (GLOVE) IMPLANT
GLOVE EXAM NITRILE MD LF STRL (GLOVE) ×2 IMPLANT
GLOVE EXAM NITRILE XL STR (GLOVE) IMPLANT
GLOVE EXAM NITRILE XS STR PU (GLOVE) IMPLANT
GLOVE SS BIOGEL STRL SZ 8 (GLOVE) ×1 IMPLANT
GLOVE SUPERSENSE BIOGEL SZ 8 (GLOVE) ×1
GLOVE SURG SS PI 8.0 STRL IVOR (GLOVE) ×6 IMPLANT
GOWN BRE IMP SLV AUR LG STRL (GOWN DISPOSABLE) IMPLANT
GOWN BRE IMP SLV AUR XL STRL (GOWN DISPOSABLE) ×4 IMPLANT
GOWN STRL REIN 2XL LVL4 (GOWN DISPOSABLE) ×4 IMPLANT
HEAD HALTER (SOFTGOODS) ×2 IMPLANT
KIT BASIN OR (CUSTOM PROCEDURE TRAY) ×2 IMPLANT
KIT ROOM TURNOVER OR (KITS) ×2 IMPLANT
NEEDLE HYPO 18GX1.5 BLUNT FILL (NEEDLE) ×2 IMPLANT
NEEDLE HYPO 25X1 1.5 SAFETY (NEEDLE) ×2 IMPLANT
NEEDLE SPNL 22GX3.5 QUINCKE BK (NEEDLE) ×2 IMPLANT
NS IRRIG 1000ML POUR BTL (IV SOLUTION) ×2 IMPLANT
PACK LAMINECTOMY NEURO (CUSTOM PROCEDURE TRAY) ×2 IMPLANT
PAD ARMBOARD 7.5X6 YLW CONV (MISCELLANEOUS) ×2 IMPLANT
PIN DISTRACTION 14MM (PIN) ×4 IMPLANT
PLATE 30MM (Plate) ×2 IMPLANT
RUBBERBAND STERILE (MISCELLANEOUS) ×4 IMPLANT
SCREW 12MM (Screw) ×12 IMPLANT
SPACER SPNL 7D LRG 16X14X7XNS (Cage) ×2 IMPLANT
SPCR SPNL 7D LRG 16X14X7XNS (Cage) ×2 IMPLANT
SPONGE GAUZE 4X4 12PLY (GAUZE/BANDAGES/DRESSINGS) IMPLANT
SPONGE INTESTINAL PEANUT (DISPOSABLE) ×2 IMPLANT
SPONGE SURGIFOAM ABS GEL SZ50 (HEMOSTASIS) ×2 IMPLANT
STAPLER SKIN PROX WIDE 3.9 (STAPLE) ×2 IMPLANT
STRIP CLOSURE SKIN 1/2X4 (GAUZE/BANDAGES/DRESSINGS) IMPLANT
SUT VIC AB 3-0 SH 8-18 (SUTURE) ×4 IMPLANT
SYR 20ML ECCENTRIC (SYRINGE) ×2 IMPLANT
SYR 3ML LL SCALE MARK (SYRINGE) ×2 IMPLANT
TOWEL OR 17X24 6PK STRL BLUE (TOWEL DISPOSABLE) ×2 IMPLANT
TOWEL OR 17X26 10 PK STRL BLUE (TOWEL DISPOSABLE) ×2 IMPLANT
TRAP SPECIMEN MUCOUS 40CC (MISCELLANEOUS) ×2 IMPLANT
WATER STERILE IRR 1000ML POUR (IV SOLUTION) ×2 IMPLANT

## 2012-04-28 NOTE — Anesthesia Procedure Notes (Signed)
Procedure Name: Intubation Performed by: Margaree Mackintosh Pre-anesthesia Checklist: Patient identified, Timeout performed, Emergency Drugs available, Suction available and Patient being monitored Patient Re-evaluated:Patient Re-evaluated prior to inductionOxygen Delivery Method: Circle system utilized Preoxygenation: Pre-oxygenation with 100% oxygen Intubation Type: IV induction Ventilation: Mask ventilation without difficulty and Oral airway inserted - appropriate to patient size Laryngoscope Size: Mac and 3 Grade View: Grade I Tube type: Oral Tube size: 7.0 mm Number of attempts: 1 Airway Equipment and Method: Stylet and LTA kit utilized Placement Confirmation: ETT inserted through vocal cords under direct vision,  positive ETCO2 and breath sounds checked- equal and bilateral Secured at: 22 cm Tube secured with: Tape Dental Injury: Teeth and Oropharynx as per pre-operative assessment

## 2012-04-28 NOTE — Anesthesia Postprocedure Evaluation (Signed)
  Anesthesia Post-op Note  Patient: Lauren Fowler  Procedure(s) Performed: Procedure(s) (LRB): ANTERIOR CERVICAL DECOMPRESSION/DISCECTOMY FUSION 2 LEVELS (N/A)  Patient Location: PACU  Anesthesia Type: General  Level of Consciousness: awake  Airway and Oxygen Therapy: Patient Spontanous Breathing  Post-op Pain: mild  Post-op Assessment: Post-op Vital signs reviewed  Post-op Vital Signs: Reviewed  Complications: No apparent anesthesia complications

## 2012-04-28 NOTE — Preoperative (Signed)
Beta Blockers   Reason not to administer Beta Blockers:Not Applicable 

## 2012-04-28 NOTE — Plan of Care (Signed)
Problem: Consults Goal: Diagnosis - Spinal Surgery Outcome: Completed/Met Date Met:  04/28/12 Cervical Spine Fusion     

## 2012-04-28 NOTE — Transfer of Care (Signed)
Immediate Anesthesia Transfer of Care Note  Patient: Lauren CROCKETT  Procedure(s) Performed: Procedure(s) (LRB): ANTERIOR CERVICAL DECOMPRESSION/DISCECTOMY FUSION 2 LEVELS (N/A)  Patient Location: PACU  Anesthesia Type: General  Level of Consciousness: awake  Airway & Oxygen Therapy: Patient Spontanous Breathing and Patient connected to nasal cannula oxygen  Post-op Assessment: Report given to PACU RN and Post -op Vital signs reviewed and stable  Post vital signs: Reviewed and stable  Complications: No apparent anesthesia complications

## 2012-04-28 NOTE — Progress Notes (Signed)
Patient awake, alert, conversant.  MAEW with full bilateral deltoid, bicep, hand intrinsic strength.  Dressing CDI. Alert, conversant.

## 2012-04-28 NOTE — Op Note (Signed)
04/28/2012  4:10 PM  PATIENT:  Lauren Fowler  41 y.o. 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, Cervical stenosis, Cervical radiculopathy C4/5, C5/6  PROCEDURE:  Procedure(s) (LRB): ANTERIOR CERVICAL DECOMPRESSION/DISCECTOMY FUSION 2 LEVELS with PEEK cages, autograft, allograft, plate H0/8, M5/7(Q/I)  SURGEON:  Surgeon(s) and Role:    * Maeola Harman, MD - Primary    * Cristi Loron, MD - Assisting  PHYSICIAN ASSISTANT:   ASSISTANTS: Poteat, RN   ANESTHESIA:   general  EBL:  Total I/O In: 1800 [I.V.:1800] Out: 25 [Blood:25]  BLOOD ADMINISTERED:none  DRAINS: none   LOCAL MEDICATIONS USED:  LIDOCAINE   SPECIMEN:  No Specimen  DISPOSITION OF SPECIMEN:  N/A  COUNTS:  YES  TOURNIQUET:  * No tourniquets in log *  DICTATION: DICTATION: Patient is 41 year old female with left arm pain and weakness with HNP, spondylosis, radiculopathy C4/5 and C5/6  PROCEDURE: Patient was brought to operating room and following the smooth and uncomplicated induction of general endotracheal anesthesia her head was placed on a horseshoe head holder she was placed in 5 pounds of Holter traction and her anterior neck was prepped and draped in usual sterile fashion. An incision was made on the left side of midline after infiltrating the skin and subcutaneous tissues with local lidocaine. The platysmal layer was incised and subplatysmal dissection was performed exposing the anterior border sternocleidomastoid muscle. Using blunt dissection the carotid sheath was kept lateral and trachea and esophagus kept medial exposing the anterior cervical spine. A bent spinal needle was placed it was felt to be the C4/5 and C5/6 levels and this was  confirmed on intraoperative x-ray. Longus coli muscles were taken down from the anterior cervical spine using electrocautery and key elevator and self-retaining retractor was placed exposing the C4/5 and C5/6 levels. The interspaces were incised and a thorough discectomy was performed. Distraction pins were placed. Initially the C4/5 level was operated. Uncinate spurs and central spondylitic ridges were drilled down with a high-speed drill. The spinal cord dura and both C5 nerve roots were widely decompressed. A large ruptured disc was removed which was causing severe left C5 nerve root compression. Hemostasis was assured. After trial sizing a 7 mm peek interbody cage was selected and packed with profuse block and autograft. This was tamped into position and countersunk appropriately. Attention was the paid to the C5/6 level, where similar decompression was performed.  Uncinate spurs and central spondylitic ridges were drilled down with a high-speed drill. The spinal cord dura and both C6 nerve roots were widely decompressed. Hemostasis was assured. After trial sizing a 7 mm peek interbody cage was selected and packed with profuse block and autograft. This was tamped into position and countersunk appropriately.Distraction weight was removed. A 30 mm trestle luxe anterior cervical plate was affixed to the cervical spine with 12 mm variable-angle screws 2 at C4, 2 at C5 and 2 at C6. All screws were well-positioned and locking mechanisms were engaged. A final X ray was obtained which showed well positioned grafts and anterior plate without complicating features. Soft tissues were inspected and found to be in good repair. The wound was irrigated. The platysma layer was closed with 3-0 Vicryl stitches and the skin was reapproximated with 3-0 Vicryl subcuticular stitches. The wound was dressed with Dermabond. Counts were correct at the end  of the case. Patient was extubated and taken to recovery in stable and satisfactory  condition.    PLAN OF CARE: Admit for overnight observation  PATIENT DISPOSITION:  PACU - hemodynamically stable.   Delay start of Pharmacological VTE agent (>24hrs) due to surgical blood loss or risk of bleeding: yes

## 2012-04-28 NOTE — Anesthesia Preprocedure Evaluation (Addendum)
Anesthesia Evaluation  Patient identified by MRN, date of birth, ID band Patient awake    Reviewed: Allergy & Precautions, H&P , NPO status , Patient's Chart, lab work & pertinent test results  Airway Mallampati: II      Dental   Pulmonary asthma , sleep apnea ,  breath sounds clear to auscultation        Cardiovascular + Valvular Problems/Murmurs Rhythm:Regular Rate:Normal  Patient states that she is being followed by family doctor at [facility].   Neuro/Psych    GI/Hepatic Neg liver ROS, GERD-  ,  Endo/Other  Hypothyroidism   Renal/GU negative Renal ROS     Musculoskeletal  (+) Fibromyalgia -  Abdominal   Peds  Hematology   Anesthesia Other Findings   Reproductive/Obstetrics                        Anesthesia Physical Anesthesia Plan  ASA: III  Anesthesia Plan: General   Post-op Pain Management:    Induction:   Airway Management Planned: Oral ETT  Additional Equipment:   Intra-op Plan:   Post-operative Plan: Extubation in OR  Informed Consent: I have reviewed the patients History and Physical, chart, labs and discussed the procedure including the risks, benefits and alternatives for the proposed anesthesia with the patient or authorized representative who has indicated his/her understanding and acceptance.   Dental advisory given  Plan Discussed with: CRNA  Anesthesia Plan Comments:        Anesthesia Quick Evaluation

## 2012-04-28 NOTE — Progress Notes (Addendum)
Chaplin called to pray with pt. Prior to surgery,per pt. Request.

## 2012-04-29 ENCOUNTER — Encounter (HOSPITAL_COMMUNITY): Payer: Self-pay | Admitting: Neurosurgery

## 2012-04-29 NOTE — Discharge Summary (Signed)
Physician Discharge Summary  Patient ID: Lauren Fowler MRN: [id number] DOB/AGE: 02/17/1971 41 y.o.  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 *    Discharged Condition: good  Hospital Course: Uncomplicated ACDF C4/5 and C5/6  Consults: None  Significant Diagnostic Studies: None  Treatments: surgery: Uncomplicated ACDF C4/5 and C5/6   Discharge Exam: Blood pressure 147/81, pulse 88, temperature 98.1 F (36.7 C), temperature source Oral, resp. rate 18, last menstrual period 04/07/2012, SpO2 97.00%. Neurologic: Alert and oriented X 3, normal strength and tone. Normal symmetric reflexes. Normal coordination and gait Wound:  Disposition: Home   Medication List  As of 04/29/2012  7:28 AM   ASK your doctor about these medications         albuterol 108 (90 BASE) MCG/ACT inhaler   Commonly known as: PROVENTIL HFA;VENTOLIN HFA   Inhale 2 puffs into the lungs every 6 (six) hours as needed.      albuterol-ipratropium 18-103 MCG/ACT inhaler   Commonly known as: COMBIVENT   Inhale 2 puffs into the lungs every 6 (six) hours as needed.      diazepam 5 MG tablet   Commonly known as: VALIUM   Take 5 mg by mouth at bedtime.      gabapentin 300 MG capsule   Commonly known as: NEURONTIN   Take 900 mg by mouth 3 (three) times daily.      levothyroxine 125 MCG tablet   Commonly known as: SYNTHROID, LEVOTHROID   Take 125 mcg by mouth daily.      omeprazole 20 MG capsule   Commonly known as: PRILOSEC   Take 20 mg by mouth daily.      oxyCODONE-acetaminophen 5-325 MG per tablet   Commonly known as: PERCOCET   Take 1 tablet by mouth every 4 (four) hours as needed. For pain             Signed: Dorian Heckle, MD 04/29/2012, 7:28 AM

## 2012-04-29 NOTE — Progress Notes (Signed)
Subjective: Patient reports doing well  Objective: Vital signs in last 24 hours: Temp:  [97.2 F (36.2 C)-98.6 F (37 C)] 98.4 F (36.9 C) (06/27 2348) Pulse Rate:  [80-98] 94  (06/27 2348) Resp:  [10-23] 16  (06/27 2348) BP: (131-156)/(80-95) 149/80 mmHg (06/27 2348) SpO2:  [96 %-100 %] 97 % (06/27 2348)  Intake/Output from previous day: 06/27 0701 - 06/28 0700 In: 2740 [P.O.:840; I.V.:1900] Out: 25 [Blood:25] Intake/Output this shift:    Physical Exam: Full strength bilateral D/B/T/HI. MAEW.  Dressing CDI  Lab Results: No results found for this basename: WBC:2,HGB:2,HCT:2,PLT:2 in the last 72 hours BMET No results found for this basename: NA:2,K:2,CL:2,CO2:2,GLUCOSE:2,BUN:2,CREATININE:2,CALCIUM:2 in the last 72 hours  Studies/Results: Dg Cervical Spine 2-3 Views  04/28/2012  *RADIOLOGY REPORT*  Clinical Data: Neck pain  CERVICAL SPINE - 2-3 VIEW  Comparison: Plain films 04/20/2012  Findings:  Film #1 demonstrates needles at C4-5 and C5-6.  Film #2 better demonstrates needles at C4-5 and C5-6.  Film #3 demonstrate C4-C6 ACDF.  The inferior aspect of the construct is  poorly seen.  IMPRESSION: As above.  Original Report Authenticated By: Elsie Stain, M.D.    Assessment/Plan: Doing well.  D/C home.    LOS: 1 day    Dorian Heckle, MD 04/29/2012, 3:06 AM

## 2012-04-29 NOTE — Discharge Instructions (Signed)

## 2012-08-17 IMAGING — CR DG CERVICAL SPINE 2 OR 3 VIEWS
1 series · 1 of 1 positions shown · non-contrast
Comparison: Plain films 04/20/2012

CLINICAL DATA: Neck pain

CERVICAL SPINE - 2-3 VIEW

[view not recorded]
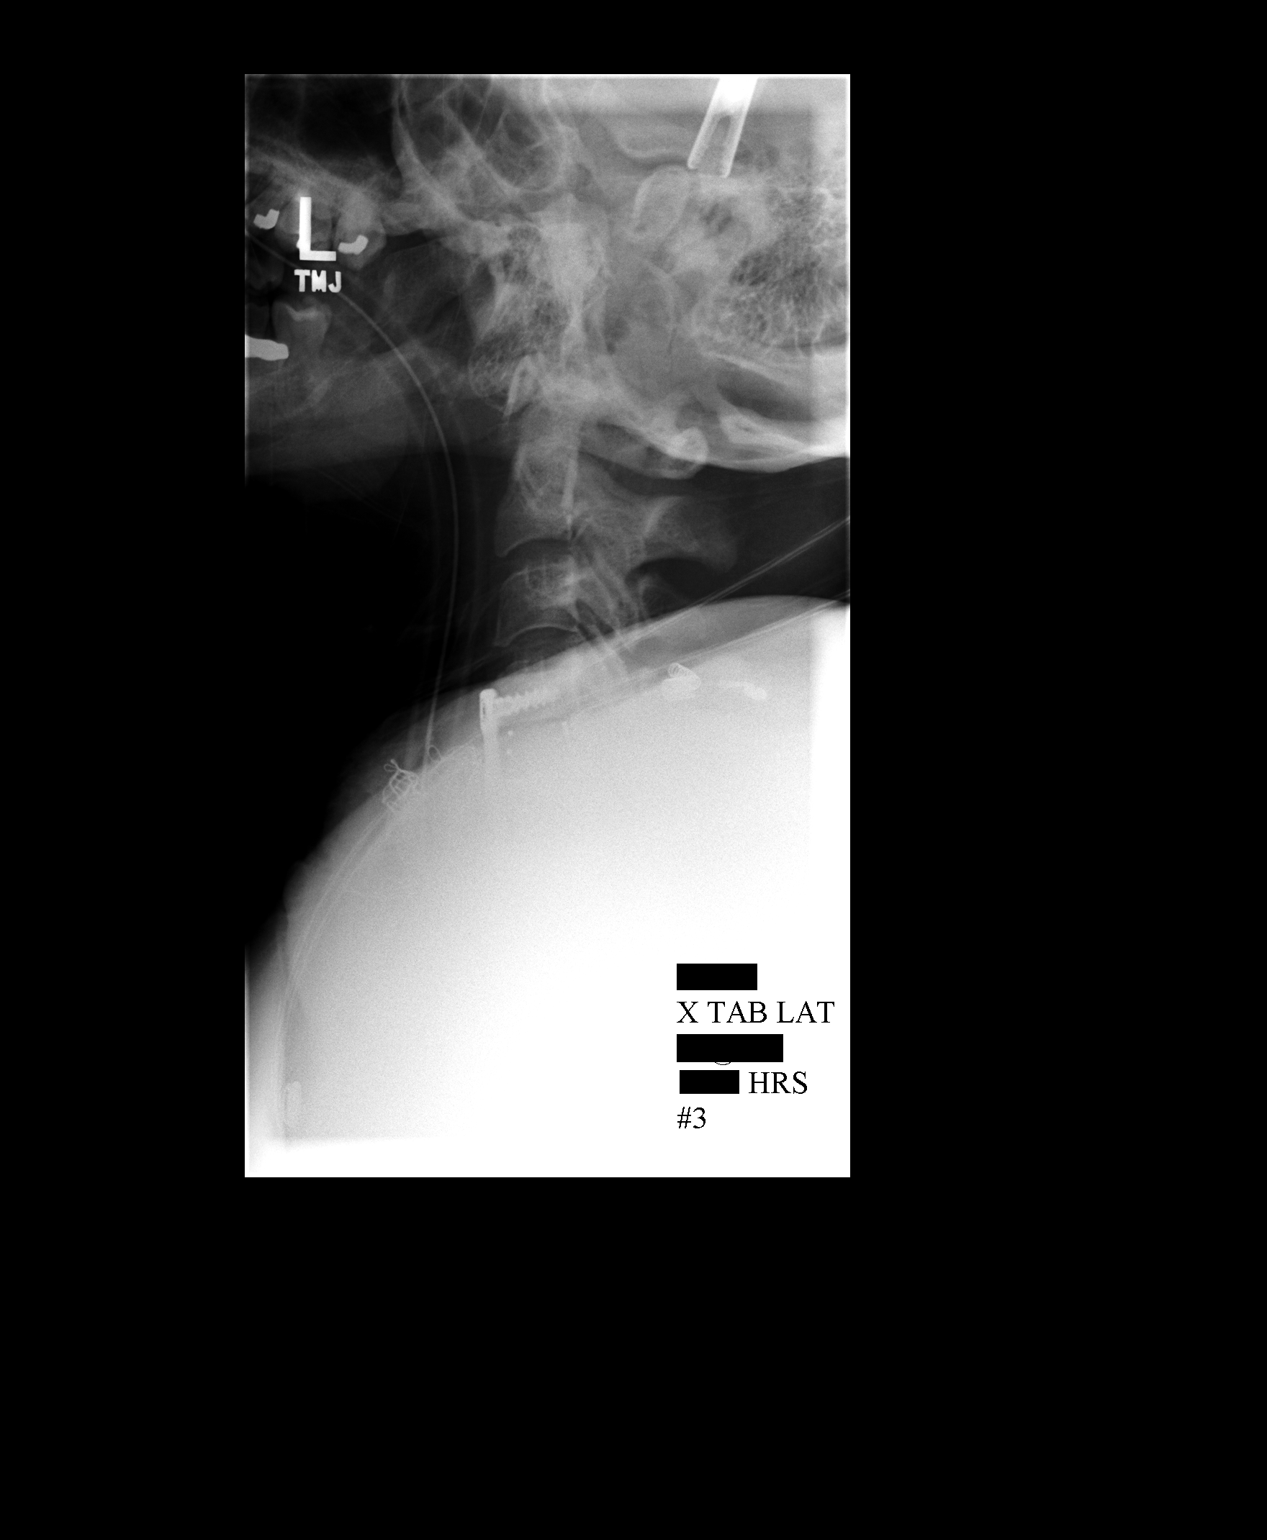

[1 of 1 positions shown; findings below may reference images not displayed]

FINDINGS: Film #1 demonstrates needles at C4-5 and C5-6.  Film #2 better
demonstrates needles at C4-5 and C5-6.  Film #3 demonstrate C4-C6
ACDF.  The inferior aspect of the construct is  poorly seen.
IMPRESSION: As above.

## 2013-10-11 LAB — HM PAP SMEAR: HM Pap smear: NEGATIVE

## 2013-11-23 ENCOUNTER — Encounter: Payer: Self-pay | Admitting: Maternal and Fetal Medicine

## 2013-12-03 ENCOUNTER — Encounter: Payer: Self-pay | Admitting: Maternal and Fetal Medicine

## 2014-01-09 ENCOUNTER — Encounter: Payer: Self-pay | Admitting: Pediatric Cardiology

## 2014-04-11 ENCOUNTER — Observation Stay: Payer: Self-pay

## 2014-05-14 ENCOUNTER — Inpatient Hospital Stay: Payer: Self-pay

## 2014-05-14 LAB — CBC WITH DIFFERENTIAL/PLATELET
BASOS ABS: 0 10*3/uL (ref 0.0–0.1)
Basophil %: 0.3 %
Eosinophil #: 0 10*3/uL (ref 0.0–0.7)
Eosinophil %: 0.3 %
HCT: 37 % (ref 35.0–47.0)
HGB: 12.4 g/dL (ref 12.0–16.0)
LYMPHS PCT: 11.7 %
Lymphocyte #: 1.4 10*3/uL (ref 1.0–3.6)
MCH: 31.3 pg (ref 26.0–34.0)
MCHC: 33.5 g/dL (ref 32.0–36.0)
MCV: 94 fL (ref 80–100)
MONO ABS: 1 x10 3/mm — AB (ref 0.2–0.9)
Monocyte %: 8.1 %
NEUTROS ABS: 9.5 10*3/uL — AB (ref 1.4–6.5)
Neutrophil %: 79.6 %
PLATELETS: 163 10*3/uL (ref 150–440)
RBC: 3.95 10*6/uL (ref 3.80–5.20)
RDW: 13.7 % (ref 11.5–14.5)
WBC: 11.9 10*3/uL — ABNORMAL HIGH (ref 3.6–11.0)

## 2014-05-16 LAB — HEMATOCRIT: HCT: 32.9 % — AB (ref 35.0–47.0)

## 2015-03-12 NOTE — H&P (Signed)
L&D Evaluation:  History:  HPI 44yo MWF presents at 40w EGA for IOL; G2P1001; GBS + this pregnancy; Pregnancy complicated b elevated BMI, + toxoplasmosis IgG, and abnormal NT increased risk for Downs ( no f/u studies elected)   Presents with other   Patient's Medical History Thyroid Disease   Patient's Surgical History other  carpal tunnel -right 2007; cervical disc 2013   Medications Pre Natal Vitamins  Tylenol (Acetaminophen)  Miralax; Zantac; Levothyroxine 125mcg;   Allergies NKDA   Social History none   Family History Non-Contributory   ROS:  ROS All systems were reviewed.  HEENT, CNS, GI, GU, Respiratory, CV, Renal and Musculoskeletal systems were found to be normal.   Exam:  Vital Signs stable   Urine Protein not completed   General no apparent distress   Mental Status clear   Chest clear   Heart normal sinus rhythm   Abdomen gravid, non-tender   Estimated Fetal Weight Average for gestational age   Fetal Position vtx   Back no CVAT   Edema 1+   Pelvic 3/20/-2   Mebranes Intact   FHT normal rate with no decels   Fetal Heart Rate 145   Ucx irregular, pitocin started at 1pm   Ucx Frequency 6 min   Length of each Contraction 45 seconds   Ucx Pain Scale 2   Impression:  Impression IOL   Plan:  Plan monitor contractions and for cervical change, antibiotics for GBBS prophylaxis, Pitocin IOL per protocol   Electronic Signatures: Yolanda BonineBurr, Tucker Minter N (CNM)  (Signed 13-Jul-15 13:34)  Authored: L&D Evaluation   Last Updated: 13-Jul-15 13:34 by Ulyses AmorBurr, Roosvelt Churchwell N (CNM)

## 2015-06-05 ENCOUNTER — Telehealth: Payer: Self-pay | Admitting: *Deleted

## 2015-06-05 ENCOUNTER — Encounter: Payer: Self-pay | Admitting: Obstetrics and Gynecology

## 2015-06-05 NOTE — Telephone Encounter (Signed)
Notified pts husband mammo normal

## 2015-06-05 NOTE — Telephone Encounter (Signed)
-----   Message from Ulyses Amor, [place] sent at 06/05/2015  8:41 AM EDT ----- Please let her know I have reviewed her MMG and it is normal

## 2015-09-04 ENCOUNTER — Encounter: Payer: Self-pay | Admitting: *Deleted

## 2015-09-17 ENCOUNTER — Encounter: Payer: Self-pay | Admitting: Obstetrics and Gynecology

## 2015-09-17 ENCOUNTER — Ambulatory Visit (INDEPENDENT_AMBULATORY_CARE_PROVIDER_SITE_OTHER): Payer: PRIVATE HEALTH INSURANCE | Admitting: Obstetrics and Gynecology

## 2015-09-17 VITALS — BP 112/65 | HR 74 | Ht 63.0 in | Wt 207.7 lb

## 2015-09-17 DIAGNOSIS — N970 Female infertility associated with anovulation: Secondary | ICD-10-CM | POA: Diagnosis not present

## 2015-09-17 NOTE — Progress Notes (Signed)
Patient ID: Anson CroftsSandra S Hillegass, female   DOB: 06/12/1971, 44 y.o.   MRN: [id number]  S: reports regular monthly menses occuring every 27-29 days with normal flow. Stopped condom use in July with no pregnancy to date. Took 4 years to conceive last pregnancy and it occurred immediately after hystosalpinogram. Desire fertility work-up due to age and increased risks.  O: A&O x4 Well groomed female in no distress thyroid normal on palpation. Pelvic not indicated  A: previous infertility Hypothyroidism- stable Obesity  P: pelvic u/s and labs on day 20 of this cycles- if all normal will consider futher work-up in no pregnancy by end of Jan. To add 1 mg of folic acid daily. Counseled on risk of 1/30 of chromosomal defects du to current age of 44.  Jenney Brester Ines BloomerBurr, CNM

## 2015-10-01 ENCOUNTER — Other Ambulatory Visit: Payer: PRIVATE HEALTH INSURANCE

## 2015-10-01 ENCOUNTER — Ambulatory Visit (INDEPENDENT_AMBULATORY_CARE_PROVIDER_SITE_OTHER): Payer: PRIVATE HEALTH INSURANCE

## 2015-10-01 DIAGNOSIS — N970 Female infertility associated with anovulation: Secondary | ICD-10-CM | POA: Diagnosis not present

## 2015-10-02 ENCOUNTER — Other Ambulatory Visit: Payer: PRIVATE HEALTH INSURANCE

## 2015-10-02 LAB — PROGESTERONE: PROGESTERONE: 9.4 ng/mL

## 2015-10-02 LAB — FSH/LH
FSH: 3.1 m[IU]/mL
LH: 1.3 m[IU]/mL

## 2015-10-02 LAB — THYROID PANEL WITH TSH
Free Thyroxine Index: 2.7 (ref 1.2–4.9)
T3 Uptake Ratio: 29 % (ref 24–39)
T4, Total: 9.2 ug/dL (ref 4.5–12.0)
TSH: 2.71 u[IU]/mL (ref 0.450–4.500)

## 2015-10-22 ENCOUNTER — Encounter: Payer: Self-pay | Admitting: Obstetrics and Gynecology

## 2015-12-06 ENCOUNTER — Other Ambulatory Visit: Payer: Self-pay | Admitting: *Deleted

## 2015-12-06 MED ORDER — LEVOTHYROXINE SODIUM 125 MCG PO TABS: 125.0000 ug | ORAL_TABLET | Freq: Every day | ORAL | Status: DC

## 2016-04-28 ENCOUNTER — Encounter: Payer: Self-pay | Admitting: Obstetrics and Gynecology

## 2016-04-28 ENCOUNTER — Ambulatory Visit (INDEPENDENT_AMBULATORY_CARE_PROVIDER_SITE_OTHER): Payer: PRIVATE HEALTH INSURANCE | Admitting: Obstetrics and Gynecology

## 2016-04-28 VITALS — BP 112/70 | HR 88 | Ht 63.0 in | Wt 188.4 lb

## 2016-04-28 DIAGNOSIS — Z01419 Encounter for gynecological examination (general) (routine) without abnormal findings: Secondary | ICD-10-CM

## 2016-04-28 DIAGNOSIS — E669 Obesity, unspecified: Secondary | ICD-10-CM | POA: Diagnosis not present

## 2016-04-28 DIAGNOSIS — N979 Female infertility, unspecified: Secondary | ICD-10-CM | POA: Diagnosis not present

## 2016-04-28 NOTE — Patient Instructions (Signed)
Place annual gynecologic exam patient instructions here.

## 2016-04-28 NOTE — Progress Notes (Signed)
Subjective:   Lauren Fowler is a 45 y.o. No obstetric history on file. Caucasian female here for a routine well-woman exam.  Patient's last menstrual period was 03/30/2016.    Current complaints: regular cycles but desires hystosalpingram this Aug. To achieve pregnancy PCP: ?       doesn't desire labs  Social History: Sexual: heterosexual Marital Status: married Living situation: with spouse Occupation: unknown occupation Tobacco/alcohol: no tobacco use Illicit drugs: no history of illicit drug use  The following portions of the patient's history were reviewed and updated as appropriate: allergies, current medications, past family history, past medical history, past social history, past surgical history and problem list.  Past Medical History Past Medical History  Diagnosis Date  . Hypothyroidism   . Fibromyalgia   . Asthma     Exercise Induced  . Sleep apnea     Snores, Husband says she stops breathing.   Marland Kitchen. Heart murmur     As a child  . GERD (gastroesophageal reflux disease)   . Headache(784.0)     tx -go to bed    Past Surgical History Past Surgical History  Procedure Laterality Date  . Carpal tunnel release      Right  . Wisdom tooth extraction    . Colonoscopy    . Anterior cervical decomp/discectomy fusion  04/28/2012    Procedure: ANTERIOR CERVICAL DECOMPRESSION/DISCECTOMY FUSION 2 LEVELS;  Surgeon: Maeola HarmanJoseph Stern, MD;  Location: [facility];  Service: Neurosurgery;  Laterality: N/A;  Cervical Four-Five Cervcial Five-Six Anterior Cervical Decompression/Diskectomy,Fusion    Gynecologic History No obstetric history on file.  Patient's last menstrual period was 03/30/2016. Contraception: none Last Pap: 2016. Results were: normal Last mammogram: 2016. Results were: normal   Obstetric History OB History  No data available    Current Medications Current Outpatient Prescriptions on File Prior to Visit  Medication Sig Dispense Refill  . levothyroxine  (SYNTHROID, LEVOTHROID) 125 MCG tablet Take 1 tablet (125 mcg total) by mouth daily. 30 tablet 6  . omeprazole (PRILOSEC) 20 MG capsule Take 20 mg by mouth daily.     No current facility-administered medications on file prior to visit.    Review of Systems Patient denies any headaches, blurred vision, shortness of breath, chest pain, abdominal pain, problems with bowel movements, urination, or intercourse.  Objective:  BP 112/70 mmHg  Pulse 88  Ht 5\' 3"  (1.6 m)  Wt 188 lb 6.4 oz (85.458 kg)  BMI 33.38 kg/m2  LMP 03/30/2016 Physical Exam  General:  Well developed, well nourished, no acute distress. She is alert and oriented x3. Skin:  Warm and dry Neck:  Midline trachea, no thyromegaly or nodules Cardiovascular: Regular rate and rhythm, no murmur heard Lungs:  Effort normal, all lung fields clear to auscultation bilaterally Breasts:  No dominant palpable mass, retraction, or nipple discharge Abdomen:  Soft, non tender, no hepatosplenomegaly or masses Pelvic:  External genitalia is normal in appearance.  The vagina is normal in appearance. The cervix is bulbous, no CMT.  Thin prep pap is not done . Uterus is felt to be normal size, shape, and contour.  No adnexal masses or tenderness noted.  Extremities:  No swelling or varicosities noted Psych:  She has a normal mood and affect  Assessment:   Healthy well-woman exam Infertility Obesity   Plan:  Will schedule consult with Dr Valentino Saxonherry F/U 1 year for AE, or sooner if needed Mammogram scheduled  Maclain Cohron Suzan NailerN Gaurav Baldree, CNM

## 2016-05-28 ENCOUNTER — Ambulatory Visit: Payer: PRIVATE HEALTH INSURANCE | Admitting: Obstetrics and Gynecology

## 2016-06-10 ENCOUNTER — Ambulatory Visit (INDEPENDENT_AMBULATORY_CARE_PROVIDER_SITE_OTHER): Payer: PRIVATE HEALTH INSURANCE | Admitting: Obstetrics and Gynecology

## 2016-06-10 ENCOUNTER — Encounter: Payer: Self-pay | Admitting: Obstetrics and Gynecology

## 2016-06-10 VITALS — BP 103/68 | HR 75 | Ht 63.0 in | Wt 178.0 lb

## 2016-06-10 DIAGNOSIS — Z319 Encounter for procreative management, unspecified: Secondary | ICD-10-CM

## 2016-06-10 MED ORDER — METFORMIN HCL 500 MG PO TABS: ORAL_TABLET | ORAL | 5 refills | Status: AC

## 2016-06-10 MED ORDER — LETROZOLE 2.5 MG PO TABS: 2.5000 mg | ORAL_TABLET | Freq: Every day | ORAL | 0 refills | Status: DC

## 2016-06-10 NOTE — Progress Notes (Signed)
GYNECOLOGY PROGRESS NOTE  Subjective:    Patient ID: Lauren Fowler, female    DOB: Nov 26, 1970, 45 y.o.   MRN: [id number]  HPI  Patient is a 45 y.o. G18P1001 female who presents for discussion of fertility.  Was referred from [facility], [place].  Patient reports long-standing history of infertility.  Is also of advanced maternal age. Had undergone infertility workup (including partner semen analysis, current age 43)) in 2014 with normal findings.  Notes that after having an HSG performed, she got pregnant the following month, with normal pregnancy and delivery.  Notes that it was recommended by Lauren Fowler that she have consultation and possibly undergo HSG again. Patient states that she desires to know all of her options.  Reports menstrual cycles are still regular, q 28-30 days with last menstrual period 05/25/2016 (approximate).  Has also lost ~ 50 lbs in 3-4 months (intentional).    The following portions of the patient's history were reviewed and updated as appropriate: allergies, current medications, past family history, past medical history, past social history, past surgical history and problem list.  Review of Systems Pertinent items noted in HPI and remainder of comprehensive ROS otherwise negative.   Objective:   Blood pressure 103/68, pulse 75, height  (1.6 m), weight 178 lb (80.7 kg), last menstrual period 05/25/2016. General appearance: alert and no distress Exam deferred.    Labs:  Results for Lauren, Fowler (MRN [id number]) as of 06/14/2016 18:14  Ref. 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 Echo texture is homogenous without evidence of focal masses. The Endometrium measures 6.9 mm.  Right Ovary measures 3.8 x 2.9 x 3.1 cm.  Possible Corpus luteal cyst measures 2 x 1.5 x 1.6cm. Left Ovary measures 2.2 x 1.7 x 1.7 cm. It is normal appearance. Survey of the adnexa demonstrates no adnexal masses. There is a small amount of  free fluid in the cul de sac.  Impression: 1. Possible Corpus Luteal Cyst on right ovary.  Recommendations: 1.Clinical correlation with the patient's History and Physical Exam   Assessment:   H/o infertility Advanced maternal age  Plan:   - Patient has already undergone previous infertility workup with negative findings.  No significant change in history since that time.  Was able to conceive spontaneously after HSG.  Discussed that HSG was not a recommend 1st line management option as it is a diagnostic tool.   Patient notes that she would also prefer not to undergo the procedure again unless necessary as it was painful.  Would recommend initiation of ovulation induction medications due to advanced maternal age.  Discussed use of ovulation kits (which patient reports using sometimes) or needs to come in to perform monthly Day #21 progesterone checks. Prescribed Femara 2.5 mg to take on Days 3-7 of cycle.  Also discussed timed coitus.  If no pregnancy within 6 months, can consider HSG at that point. Will also order AMH. [facility] -  Patient to f/u in 3 months with midwife.  If no pregnancy, can increase dose to 5 mg.  - Also to begin daily Metformin 500 mg BID to enhance affects of Femara.  - Will need to discontinue weight loss medications once actively trying to conceive.  A total of 25 minutes were spent face-to-face with the patient during this encounter and over half of that  time dealt with counseling and coordination of care.   Hildred LaserAnika Darnell Jeschke, MD [facility]

## 2016-06-11 ENCOUNTER — Encounter: Payer: Self-pay | Admitting: Family Medicine

## 2016-06-13 LAB — ANTI MULLERIAN HORMONE: ANTI-MULLERIAN HORMONE (AMH): 1.35 ng/mL

## 2016-06-23 ENCOUNTER — Other Ambulatory Visit: Payer: Self-pay | Admitting: *Deleted

## 2016-07-16 ENCOUNTER — Other Ambulatory Visit: Payer: Self-pay | Admitting: Obstetrics and Gynecology

## 2016-07-23 ENCOUNTER — Encounter: Payer: Self-pay | Admitting: Obstetrics and Gynecology

## 2016-07-27 ENCOUNTER — Encounter: Payer: Self-pay | Admitting: Obstetrics and Gynecology

## 2016-08-06 ENCOUNTER — Other Ambulatory Visit: Payer: Self-pay | Admitting: *Deleted

## 2016-08-06 ENCOUNTER — Other Ambulatory Visit: Payer: PRIVATE HEALTH INSURANCE

## 2016-08-06 ENCOUNTER — Encounter: Payer: Self-pay | Admitting: Obstetrics and Gynecology

## 2016-08-06 DIAGNOSIS — N926 Irregular menstruation, unspecified: Secondary | ICD-10-CM

## 2016-08-07 ENCOUNTER — Encounter: Payer: Self-pay | Admitting: Emergency Medicine

## 2016-08-07 ENCOUNTER — Emergency Department
Admission: EM | Admit: 2016-08-07 | Discharge: 2016-08-07 | Disposition: A | Discharge status: Home / Self Care | Payer: PRIVATE HEALTH INSURANCE | Attending: Emergency Medicine | Admitting: Emergency Medicine

## 2016-08-07 DIAGNOSIS — O209 Hemorrhage in early pregnancy, unspecified: Secondary | ICD-10-CM | POA: Diagnosis present

## 2016-08-07 DIAGNOSIS — Z79899 Other long term (current) drug therapy: Secondary | ICD-10-CM | POA: Insufficient documentation

## 2016-08-07 DIAGNOSIS — Z87891 Personal history of nicotine dependence: Secondary | ICD-10-CM | POA: Insufficient documentation

## 2016-08-07 DIAGNOSIS — Z3A01 Less than 8 weeks gestation of pregnancy: Secondary | ICD-10-CM | POA: Diagnosis not present

## 2016-08-07 DIAGNOSIS — E039 Hypothyroidism, unspecified: Secondary | ICD-10-CM | POA: Insufficient documentation

## 2016-08-07 DIAGNOSIS — Z7984 Long term (current) use of oral hypoglycemic drugs: Secondary | ICD-10-CM | POA: Diagnosis not present

## 2016-08-07 DIAGNOSIS — O200 Threatened abortion: Secondary | ICD-10-CM | POA: Diagnosis not present

## 2016-08-07 DIAGNOSIS — J45909 Unspecified asthma, uncomplicated: Secondary | ICD-10-CM | POA: Insufficient documentation

## 2016-08-07 LAB — URINALYSIS COMPLETE WITH MICROSCOPIC (ARMC ONLY)
Bilirubin Urine: NEGATIVE
Glucose, UA: NEGATIVE mg/dL
KETONES UR: NEGATIVE mg/dL
Leukocytes, UA: NEGATIVE
Nitrite: NEGATIVE
PH: 6 (ref 5.0–8.0)
PROTEIN: NEGATIVE mg/dL
Specific Gravity, Urine: 1.004 — ABNORMAL LOW (ref 1.005–1.030)

## 2016-08-07 LAB — CBC WITH DIFFERENTIAL/PLATELET
BASOS ABS: 0 10*3/uL (ref 0–0.1)
Basophils Relative: 0 %
Eosinophils Absolute: 0.1 10*3/uL (ref 0–0.7)
Eosinophils Relative: 1 %
HEMATOCRIT: 39.1 % (ref 35.0–47.0)
HEMOGLOBIN: 13.4 g/dL (ref 12.0–16.0)
LYMPHS PCT: 19 %
Lymphs Abs: 1.6 10*3/uL (ref 1.0–3.6)
MCH: 32.4 pg (ref 26.0–34.0)
MCHC: 34.2 g/dL (ref 32.0–36.0)
MCV: 94.7 fL (ref 80.0–100.0)
MONO ABS: 0.7 10*3/uL (ref 0.2–0.9)
MONOS PCT: 9 %
NEUTROS ABS: 6 10*3/uL (ref 1.4–6.5)
NEUTROS PCT: 71 %
Platelets: 228 10*3/uL (ref 150–440)
RBC: 4.13 MIL/uL (ref 3.80–5.20)
RDW: 13.4 % (ref 11.5–14.5)
WBC: 8.4 10*3/uL (ref 3.6–11.0)

## 2016-08-07 LAB — COMPREHENSIVE METABOLIC PANEL
ALK PHOS: 51 U/L (ref 38–126)
ALT: 21 U/L (ref 14–54)
AST: 18 U/L (ref 15–41)
Albumin: 4.1 g/dL (ref 3.5–5.0)
Anion gap: 6 (ref 5–15)
BUN: 10 mg/dL (ref 6–20)
CALCIUM: 9.1 mg/dL (ref 8.9–10.3)
CHLORIDE: 104 mmol/L (ref 101–111)
CO2: 28 mmol/L (ref 22–32)
CREATININE: 0.73 mg/dL (ref 0.44–1.00)
GFR calc non Af Amer: >60 mL/min (ref >60)
GLUCOSE: 98 mg/dL (ref 65–99)
Potassium: 3.5 mmol/L (ref 3.5–5.1)
SODIUM: 138 mmol/L (ref 135–145)
Total Bilirubin: 0.4 mg/dL (ref 0.3–1.2)
Total Protein: 7.4 g/dL (ref 6.5–8.1)

## 2016-08-07 LAB — ABO/RH: ABO/RH(D): AB POS

## 2016-08-07 LAB — BETA HCG QUANT (REF LAB): HCG QUANT: 31 m[IU]/mL

## 2016-08-07 LAB — HCG, QUANTITATIVE, PREGNANCY: hCG, Beta Chain, Quant, S: 30 m[IU]/mL — ABNORMAL HIGH (ref <5)

## 2016-08-07 LAB — PREGNANCY, URINE: Preg Test, Ur: NEGATIVE

## 2016-08-07 NOTE — ED Triage Notes (Signed)
Pt arrives today with reports of being 4-[redacted] weeks pregnant  She has been spotting for approx two days and she saw [facility] yesterday with a HCG of 31 and then this am she awakened to heavy bright red bleeding  1/10 pain reported

## 2016-08-07 NOTE — ED Notes (Signed)
This RN called lab to check on hcg quantitative.  Lab informed me that they would start working on it now.

## 2016-08-07 NOTE — ED Provider Notes (Addendum)
[facility] Emergency Department Provider Note  ____________________________________________   I have reviewed the triage vital signs and the nursing notes.   HISTORY  Chief Complaint Vaginal Bleeding    HPI Lauren Fowler is a 45 y.o. female Who is G3 P2, whose last menstrual period was in late August, August 23. Patient is trying to get pregnant. She is on follicle stimulant medication patient noted some painless vaginal bleeding 2 days ago. She saw her primary care OB and was found to have a Quant of 31. This morning she had more like menstrual period bleeding. She denies any pain. She states "the pain scale does not go low enough to describe my pain". She has had no fever no chills, no passage of tissue.denies hematuria or dysuria.    Past Medical History:  Diagnosis Date  . Asthma    Exercise Induced  . Fibromyalgia   . GERD (gastroesophageal reflux disease)   . Headache(784.0)    tx -go to bed  . Heart murmur    As a child  . Hypothyroidism   . Sleep apnea    Snores, Husband says she stops breathing.     There are no active problems to display for this patient.   Past Surgical History:  Procedure Laterality Date  . ANTERIOR CERVICAL DECOMP/DISCECTOMY FUSION  04/28/2012   Procedure: ANTERIOR CERVICAL DECOMPRESSION/DISCECTOMY FUSION 2 LEVELS;  Surgeon: Maeola Harman, MD;  Location: [facility];  Service: Neurosurgery;  Laterality: N/A;  Cervical Four-Five Cervcial Five-Six Anterior Cervical Decompression/Diskectomy,Fusion  . CARPAL TUNNEL RELEASE     Right  . COLONOSCOPY    . WISDOM TOOTH EXTRACTION      Prior to Admission medications   Medication Sig Start Date End Date Taking? Authorizing Provider  buPROPion (WELLBUTRIN SR) 150 MG 12 hr tablet Take 150 mg by mouth daily.    Historical Provider, MD  cetirizine (ZYRTEC) 10 MG tablet Take 10 mg by mouth. 01/22/16   Historical Provider, MD  fluticasone (FLONASE) 50 MCG/ACT nasal spray Place  into the nose. 01/22/16   Historical Provider, MD  letrozole (FEMARA) 2.5 MG tablet Take 1 tablet (2.5 mg total) by mouth daily. 06/10/16   Hildred Laser, MD  levothyroxine (SYNTHROID, LEVOTHROID) 125 MCG tablet  11/18/13   Historical Provider, MD  levothyroxine (SYNTHROID, LEVOTHROID) 125 MCG tablet TAKE 1 TABLET BY MOUTH DAILY. 07/16/16   Melody N Shambley, CNM  metFORMIN (GLUCOPHAGE) 500 MG tablet Take one tablet by mouth daily for one week. Then increase to one tablet twice a day for one week. 06/10/16   Hildred Laser, MD  naltrexone (DEPADE) 50 MG tablet Take 25 mg by mouth daily.    Historical Provider, MD  omeprazole (PRILOSEC) 20 MG capsule Take 20 mg by mouth daily.    Historical Provider, MD  PRENATAL 28-0.8 MG TABS Take by mouth.    Historical Provider, MD    Allergies Review of patient's allergies indicates no known allergies.  Family History  Problem Relation Age of Onset  . Heart disease Maternal Uncle   . Thyroid disease Mother   . Thyroid disease Sister   . Thyroid disease Maternal Grandmother     Social History Social History  Substance Use Topics  . Smoking status: Former Smoker    Years: 8.00  . Smokeless tobacco: Former Neurosurgeon    Quit date: 10/02/2003  . Alcohol use No    Review of Systems Constitutional: No fever/chills Eyes: No visual changes. ENT: No sore throat. No  stiff neck no neck pain Cardiovascular: Denies chest pain. Respiratory: Denies shortness of breath. Gastrointestinal:   no vomiting.  No diarrhea.  No constipation. Genitourinary: Negative for dysuria. Musculoskeletal: Negative lower extremity swelling Skin: Negative for rash. Neurological: Negative for severe headaches, focal weakness or numbness. 10-point ROS otherwise negative.  ____________________________________________   PHYSICAL EXAM:  VITAL SIGNS: ED Triage Vitals  Enc Vitals Group     BP 08/07/16 1043 (!) 152/86     Pulse Rate 08/07/16 1043 84     Resp 08/07/16 1043 18     Temp  08/07/16 1043 98.3 F (36.8 C)     Temp Source 08/07/16 1043 Oral     SpO2 08/07/16 1043 100 %     Weight 08/07/16 1043 174 lb (78.9 kg)     Height 08/07/16 1043 5\' 3"  (1.6 m)     Head Circumference --      Peak Flow --      Pain Score 08/07/16 1044 1     Pain Loc --      Pain Edu? --      Excl. in GC? --     Constitutional: Alert and oriented. Well appearing and in no acute distress. Eyes: Conjunctivae are normal. PERRL. EOMI. Head: Atraumatic. Nose: No congestion/rhinnorhea. Mouth/Throat: Mucous membranes are moist.  Oropharynx non-erythematous. Neck: No stridor.   Nontender with no meningismus Cardiovascular: Normal rate, regular rhythm. Grossly normal heart sounds.  Good peripheral circulation. Respiratory: Normal respiratory effort.  No retractions. Lungs CTAB. Abdominal: Soft and nontender. No distention. No guarding no rebound Back:  There is no focal tenderness or step off.  there is no midline tenderness there are no lesions noted. there is no CVA tenderness Musculoskeletal: No lower extremity tenderness, no upper extremity tenderness. No joint effusions, no DVT signs strong distal pulses no edema Neurologic:  Normal speech and language. No gross focal neurologic deficits are appreciated.  Skin:  Skin is warm, dry and intact. No rash noted. Psychiatric: Mood and affect are normal. Speech and behavior are normal.  ____________________________________________   LABS (all labs ordered are listed, but only abnormal results are displayed)  Labs Reviewed  CBC WITH DIFFERENTIAL/PLATELET  HCG, QUANTITATIVE, PREGNANCY  COMPREHENSIVE METABOLIC PANEL  PREGNANCY, URINE  URINALYSIS COMPLETEWITH MICROSCOPIC ([facility] ONLY)  ABO/RH   ____________________________________________  EKG  I personally interpreted any EKGs ordered by me or triage  ____________________________________________  RADIOLOGY  I reviewed any imaging ordered by me or triage that were performed during my  shift and, if possible, patient and/or family made aware of any abnormal findings. ____________________________________________   PROCEDURES  Procedure(s) performed: None  Procedures  Critical Care performed: None  ____________________________________________   INITIAL IMPRESSION / ASSESSMENT AND PLAN / ED COURSE  Pertinent labs & imaging results that were available during my care of the patient were reviewed by me and considered in my medical decision making (see chart for details).  Patient with essentially painless vaginal bleeding and very early pregnancy in the context of follicle stimulating hormones . She knows she believes that she is AB+,+ however we willrecheck her type. We will recheck her Quant, we will perform pelvic exam, we will discuss with her OB whether they wish [place] to do a ultrasound or not.  ----------------------------------------- 5:00 PM on 08/07/2016 -----------------------------------------  Pelvic exam: Female nurse chaperone present, no external lesions noted, physiologic vaginal discharge noted with no purulent discharge, no cervical motion tenderness, no adnexal tenderness or mass, there is no significant uterine tenderness or  mass. Os is open to perhaps 1.5 cm and there is moderate amounts of dark blood in the vault with no evidence of ongoing brisk bleeding.  Patient's Sharene ButtersQuant is 30 which shows no increase essentially from yesterday. This is most likely a failed pregnancy. There is no lateralizing signs to suggest ectopic and I have low suspicion that ultrasound will betray any evidence of  one nor is it likely show an IUP. I will discuss with her OB/GYN's whom I have just paged, to see if they wish further imaging or simple outpatient follow-up.  ----------------------------------------- 5:05 PM on 08/07/2016 -----------------------------------------  I discussed with Dr. Valentino Saxonherry, who agrees the plan and management and states that she does not feel an  ultrasound is indicated. Patient is very comfortable this time in no acute distress with no lateralizing signs. Non-progressive Quant partially open on its and vaginal bleeding which is not life-threatening at this time. Return precautions and follow-up given and understood. She has been given bleeding precautions and she will follow-up for repeat Quant on Monday with her OB as per their instructions.   Clinical Course   ____________________________________________   FINAL CLINICAL IMPRESSION(S) / ED DIAGNOSES  Final diagnoses:  None      This chart was dictated using voice recognition software.  Despite best efforts to proofread,  errors can occur which can change meaning.      Jeanmarie PlantJames A McShane, MD 08/07/16 1701    Jeanmarie PlantJames A McShane, MD 08/07/16 [phone]

## 2016-08-10 ENCOUNTER — Other Ambulatory Visit: Payer: PRIVATE HEALTH INSURANCE

## 2016-08-10 ENCOUNTER — Other Ambulatory Visit: Payer: Self-pay | Admitting: Obstetrics and Gynecology

## 2016-08-11 LAB — BETA HCG QUANT (REF LAB): HCG QUANT: 4 m[IU]/mL

## 2016-08-14 ENCOUNTER — Ambulatory Visit: Payer: PRIVATE HEALTH INSURANCE | Admitting: Obstetrics and Gynecology

## 2016-09-10 ENCOUNTER — Ambulatory Visit: Payer: PRIVATE HEALTH INSURANCE | Admitting: Obstetrics and Gynecology

## 2016-11-13 ENCOUNTER — Encounter: Payer: Self-pay | Admitting: Obstetrics and Gynecology

## 2016-12-16 ENCOUNTER — Encounter: Payer: Self-pay | Admitting: Obstetrics and Gynecology

## 2016-12-17 ENCOUNTER — Other Ambulatory Visit: Payer: Self-pay | Admitting: Obstetrics and Gynecology

## 2016-12-17 MED ORDER — LETROZOLE 2.5 MG TABS #30 CALGB 40503: 5.0000 mg | ORAL_TABLET | Freq: Every day | ORAL | 1 refills | Status: AC

## 2017-03-02 ENCOUNTER — Other Ambulatory Visit: Payer: Self-pay | Admitting: Obstetrics and Gynecology

## 2017-04-30 ENCOUNTER — Encounter: Payer: PRIVATE HEALTH INSURANCE | Admitting: Obstetrics and Gynecology
